# Patient Record
Sex: Female | Born: 1960 | Race: White | Hispanic: No | Marital: Single | Smoking: Former smoker
Health system: Southern US, Community
[De-identification: ages and names within clinical notes are randomized; demographics above are authoritative.]

## PROBLEM LIST (undated history)

## (undated) DIAGNOSIS — R55 Syncope and collapse: Secondary | ICD-10-CM

## (undated) DIAGNOSIS — H409 Unspecified glaucoma: Secondary | ICD-10-CM

## (undated) DIAGNOSIS — I10 Essential (primary) hypertension: Secondary | ICD-10-CM

## (undated) DIAGNOSIS — F329 Major depressive disorder, single episode, unspecified: Secondary | ICD-10-CM

## (undated) DIAGNOSIS — Z9889 Other specified postprocedural states: Secondary | ICD-10-CM

## (undated) DIAGNOSIS — K589 Irritable bowel syndrome without diarrhea: Secondary | ICD-10-CM

## (undated) DIAGNOSIS — D45 Polycythemia vera: Secondary | ICD-10-CM

## (undated) DIAGNOSIS — D751 Secondary polycythemia: Secondary | ICD-10-CM

## (undated) DIAGNOSIS — M810 Age-related osteoporosis without current pathological fracture: Secondary | ICD-10-CM

## (undated) DIAGNOSIS — H209 Unspecified iridocyclitis: Secondary | ICD-10-CM

## (undated) DIAGNOSIS — I5032 Chronic diastolic (congestive) heart failure: Secondary | ICD-10-CM

## (undated) DIAGNOSIS — M1990 Unspecified osteoarthritis, unspecified site: Secondary | ICD-10-CM

## (undated) DIAGNOSIS — D1803 Hemangioma of intra-abdominal structures: Secondary | ICD-10-CM

## (undated) DIAGNOSIS — G4730 Sleep apnea, unspecified: Secondary | ICD-10-CM

## (undated) DIAGNOSIS — Z8719 Personal history of other diseases of the digestive system: Secondary | ICD-10-CM

## (undated) DIAGNOSIS — L400 Psoriasis vulgaris: Secondary | ICD-10-CM

## (undated) DIAGNOSIS — Z79620 Long term (current) use of immunosuppressive biologic: Secondary | ICD-10-CM

## (undated) DIAGNOSIS — K760 Fatty (change of) liver, not elsewhere classified: Secondary | ICD-10-CM

## (undated) DIAGNOSIS — M1712 Unilateral primary osteoarthritis, left knee: Secondary | ICD-10-CM

## (undated) DIAGNOSIS — L4050 Arthropathic psoriasis, unspecified: Secondary | ICD-10-CM

## (undated) DIAGNOSIS — E785 Hyperlipidemia, unspecified: Secondary | ICD-10-CM

## (undated) DIAGNOSIS — G4700 Insomnia, unspecified: Secondary | ICD-10-CM

## (undated) DIAGNOSIS — I700 Atherosclerosis of aorta: Secondary | ICD-10-CM

## (undated) DIAGNOSIS — G4733 Obstructive sleep apnea (adult) (pediatric): Secondary | ICD-10-CM

## (undated) DIAGNOSIS — F32A Depression, unspecified: Secondary | ICD-10-CM

## (undated) DIAGNOSIS — E039 Hypothyroidism, unspecified: Secondary | ICD-10-CM

## (undated) DIAGNOSIS — R112 Nausea with vomiting, unspecified: Secondary | ICD-10-CM

## (undated) DIAGNOSIS — R0609 Other forms of dyspnea: Secondary | ICD-10-CM

## (undated) DIAGNOSIS — K219 Gastro-esophageal reflux disease without esophagitis: Secondary | ICD-10-CM

## (undated) DIAGNOSIS — M47812 Spondylosis without myelopathy or radiculopathy, cervical region: Secondary | ICD-10-CM

## (undated) DIAGNOSIS — M65949 Unspecified synovitis and tenosynovitis, unspecified hand: Secondary | ICD-10-CM

## (undated) HISTORY — PX: CHOLECYSTECTOMY: SHX55

## (undated) HISTORY — PX: TONSILLECTOMY: SUR1361

## (undated) HISTORY — PX: APPENDECTOMY: SHX54

## (undated) HISTORY — PX: ABDOMINAL HYSTERECTOMY: SHX81

---
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
## 2008-10-06 DIAGNOSIS — L4050 Arthropathic psoriasis, unspecified: Secondary | ICD-10-CM | POA: Insufficient documentation

## 2015-01-07 DIAGNOSIS — D1803 Hemangioma of intra-abdominal structures: Secondary | ICD-10-CM

## 2015-01-07 HISTORY — DX: Hemangioma of intra-abdominal structures: D18.03

## 2015-06-05 DIAGNOSIS — Z8719 Personal history of other diseases of the digestive system: Secondary | ICD-10-CM | POA: Insufficient documentation

## 2015-07-18 DIAGNOSIS — F5104 Psychophysiologic insomnia: Secondary | ICD-10-CM | POA: Insufficient documentation

## 2015-09-21 DIAGNOSIS — D1803 Hemangioma of intra-abdominal structures: Secondary | ICD-10-CM | POA: Insufficient documentation

## 2015-09-21 DIAGNOSIS — D751 Secondary polycythemia: Secondary | ICD-10-CM | POA: Insufficient documentation

## 2015-11-15 ENCOUNTER — Other Ambulatory Visit: Payer: Self-pay | Admitting: Internal Medicine

## 2015-11-15 DIAGNOSIS — Z1231 Encounter for screening mammogram for malignant neoplasm of breast: Secondary | ICD-10-CM

## 2015-12-21 ENCOUNTER — Encounter: Payer: Self-pay | Admitting: Radiology

## 2015-12-21 ENCOUNTER — Ambulatory Visit
Admission: RE | Admit: 2015-12-21 | Discharge: 2015-12-21 | Disposition: A | Discharge status: Home / Self Care | Payer: Medicare Other | Source: Ambulatory Visit | Attending: Internal Medicine | Admitting: Internal Medicine

## 2015-12-21 DIAGNOSIS — Z1231 Encounter for screening mammogram for malignant neoplasm of breast: Secondary | ICD-10-CM | POA: Insufficient documentation

## 2015-12-24 DIAGNOSIS — K219 Gastro-esophageal reflux disease without esophagitis: Secondary | ICD-10-CM | POA: Insufficient documentation

## 2016-03-26 DIAGNOSIS — K760 Fatty (change of) liver, not elsewhere classified: Secondary | ICD-10-CM | POA: Insufficient documentation

## 2016-07-11 ENCOUNTER — Other Ambulatory Visit: Payer: Self-pay | Admitting: Internal Medicine

## 2016-07-11 DIAGNOSIS — R911 Solitary pulmonary nodule: Secondary | ICD-10-CM

## 2016-07-18 ENCOUNTER — Ambulatory Visit
Admission: RE | Admit: 2016-07-18 | Discharge: 2016-07-18 | Disposition: A | Discharge status: Home / Self Care | Payer: Medicare Other | Source: Ambulatory Visit | Attending: Internal Medicine | Admitting: Internal Medicine

## 2016-07-18 DIAGNOSIS — I700 Atherosclerosis of aorta: Secondary | ICD-10-CM | POA: Diagnosis not present

## 2016-07-18 DIAGNOSIS — R911 Solitary pulmonary nodule: Secondary | ICD-10-CM | POA: Insufficient documentation

## 2016-10-22 DIAGNOSIS — M1712 Unilateral primary osteoarthritis, left knee: Secondary | ICD-10-CM | POA: Insufficient documentation

## 2017-01-27 ENCOUNTER — Other Ambulatory Visit: Payer: Federal, State, Local not specified - PPO

## 2017-01-30 ENCOUNTER — Inpatient Hospital Stay: Admission: RE | Admit: 2017-01-30 | Payer: Federal, State, Local not specified - PPO | Source: Ambulatory Visit

## 2017-02-05 ENCOUNTER — Ambulatory Visit: Admission: RE | Admit: 2017-02-05 | Payer: Medicare Other | Source: Ambulatory Visit | Admitting: Orthopedic Surgery

## 2017-02-05 ENCOUNTER — Encounter: Admission: RE | Payer: Self-pay | Source: Ambulatory Visit

## 2017-02-05 SURGERY — ARTHROSCOPY, KNEE, WITH MEDIAL MENISCECTOMY
Anesthesia: Choice | Laterality: Left

## 2017-03-03 ENCOUNTER — Other Ambulatory Visit: Payer: Self-pay

## 2017-03-03 ENCOUNTER — Encounter
Admission: RE | Admit: 2017-03-03 | Discharge: 2017-03-03 | Disposition: A | Discharge status: Home / Self Care | Payer: Medicare Other | Source: Ambulatory Visit | Attending: Orthopedic Surgery | Admitting: Orthopedic Surgery

## 2017-03-03 DIAGNOSIS — Z01818 Encounter for other preprocedural examination: Secondary | ICD-10-CM | POA: Insufficient documentation

## 2017-03-03 HISTORY — DX: Syncope and collapse: R55

## 2017-03-03 HISTORY — DX: Unspecified iridocyclitis: H20.9

## 2017-03-03 HISTORY — DX: Spondylosis without myelopathy or radiculopathy, cervical region: M47.812

## 2017-03-03 HISTORY — DX: Secondary polycythemia: D75.1

## 2017-03-03 HISTORY — DX: Major depressive disorder, single episode, unspecified: F32.9

## 2017-03-03 HISTORY — DX: Gastro-esophageal reflux disease without esophagitis: K21.9

## 2017-03-03 HISTORY — DX: Hypothyroidism, unspecified: E03.9

## 2017-03-03 HISTORY — DX: Unspecified osteoarthritis, unspecified site: M19.90

## 2017-03-03 HISTORY — DX: Hemangioma of intra-abdominal structures: D18.03

## 2017-03-03 HISTORY — DX: Other specified postprocedural states: Z98.890

## 2017-03-03 HISTORY — DX: Irritable bowel syndrome, unspecified: K58.9

## 2017-03-03 HISTORY — DX: Unspecified glaucoma: H40.9

## 2017-03-03 HISTORY — DX: Depression, unspecified: F32.A

## 2017-03-03 HISTORY — DX: Nausea with vomiting, unspecified: R11.2

## 2017-03-03 NOTE — Patient Instructions (Signed)
Your procedure is scheduled on: Thursday 03/12/17 Report to [place]. To find out your arrival time please call [phone] between 1PM - 3PM on Wednesday 03/11/17.  Remember: Instructions that are not followed completely may result in serious medical risk, up to and including death, or upon the discretion of your surgeon and anesthesiologist your surgery may need to be rescheduled.     _X__ 1. Do not eat food after midnight the night before your procedure.                 No gum chewing or hard candies. You may drink clear liquids up to 2 hours                 before you are scheduled to arrive for your surgery- DO not drink clear                 liquids within 2 hours of the start of your surgery.                 Clear Liquids include:  water, apple juice without pulp, clear carbohydrate                 drink such as Clearfast of Gartorade, Black Coffee or Tea (Do not add                 anything to coffee or tea).  __X__2.  On the morning of surgery brush your teeth with toothpaste and water, you                 may rinse your mouth with mouthwash if you wish.  Do not swallow any              toothpaste of mouthwash.     _X__ 3.  No Alcohol for 24 hours before or after surgery.   _X__ 4.  Do Not Smoke or use e-cigarettes For 24 Hours Prior to Your Surgery.                 Do not use any chewable tobacco products for at least 6 hours prior to                 surgery.  ____  5.  Bring all medications with you on the day of surgery if instructed.   ____  6.  Notify your doctor if there is any change in your medical condition      (cold, fever, infections).     Do not wear jewelry, make-up, hairpins, clips or nail polish. Do not wear lotions, powders, or perfumes. You may wear deodorant. Do not shave 48 hours prior to surgery. Men may shave face and neck. Do not bring valuables to the hospital.    [facility] is not responsible  for any belongings or valuables.  Contacts, dentures or bridgework may not be worn into surgery. Leave your suitcase in the car. After surgery it may be brought to your room. For patients admitted to the hospital, discharge time is determined by your treatment team.   Patients discharged the day of surgery will not be allowed to drive home.   Please read over the following fact sheets that you were given:   MRSA Information   __X__ Take these medicines the morning of surgery with A SIP OF WATER:    1. LEVOTHYROXINE  2.   3.   4.  5.  6.  ____ Fleet Enema (as directed)   __X__ Use CHG Soap as directed (USE YOUR REGULAR SOAP)  ____ Use inhalers on the day of surgery  ____ Stop metformin/Janumet/Farxiga 2 days prior to surgery    ____ Take 1/2 of usual insulin dose the night before surgery. No insulin the morning          of surgery.   __X__ Stop Blood Thinners Coumadin/Plavix/Xarelto/Pleta/Pradaxa/Eliquis/Effient/Aspirin  on STOPPED 02/05/17  __X__ Stop Anti-inflammatories such as Advil, Ibuprofen, Motrin, BC or Goodies  Powder, Naprosyn, Naproxen, Aleve    __X__ Stop herbal supplements, fish oil or vitamin E until after surgery.    ____ Bring C-Pap to the hospital.

## 2017-03-12 ENCOUNTER — Ambulatory Visit: Payer: Medicare Other | Admitting: Anesthesiology

## 2017-03-12 ENCOUNTER — Encounter
Admission: RE | Disposition: A | Discharge status: Home / Self Care | Payer: Self-pay | Source: Ambulatory Visit | Attending: Orthopedic Surgery

## 2017-03-12 ENCOUNTER — Encounter: Payer: Self-pay | Admitting: Emergency Medicine

## 2017-03-12 ENCOUNTER — Ambulatory Visit
Admission: RE | Admit: 2017-03-12 | Discharge: 2017-03-12 | Disposition: A | Discharge status: Home / Self Care | Payer: Medicare Other | Source: Ambulatory Visit | Attending: Orthopedic Surgery | Admitting: Orthopedic Surgery

## 2017-03-12 DIAGNOSIS — M23222 Derangement of posterior horn of medial meniscus due to old tear or injury, left knee: Secondary | ICD-10-CM | POA: Insufficient documentation

## 2017-03-12 DIAGNOSIS — K219 Gastro-esophageal reflux disease without esophagitis: Secondary | ICD-10-CM | POA: Diagnosis not present

## 2017-03-12 DIAGNOSIS — Z79899 Other long term (current) drug therapy: Secondary | ICD-10-CM | POA: Diagnosis not present

## 2017-03-12 DIAGNOSIS — M175 Other unilateral secondary osteoarthritis of knee: Secondary | ICD-10-CM | POA: Diagnosis not present

## 2017-03-12 DIAGNOSIS — Z87891 Personal history of nicotine dependence: Secondary | ICD-10-CM | POA: Diagnosis not present

## 2017-03-12 DIAGNOSIS — D1803 Hemangioma of intra-abdominal structures: Secondary | ICD-10-CM | POA: Diagnosis not present

## 2017-03-12 DIAGNOSIS — D751 Secondary polycythemia: Secondary | ICD-10-CM | POA: Diagnosis not present

## 2017-03-12 DIAGNOSIS — E039 Hypothyroidism, unspecified: Secondary | ICD-10-CM | POA: Insufficient documentation

## 2017-03-12 DIAGNOSIS — F329 Major depressive disorder, single episode, unspecified: Secondary | ICD-10-CM | POA: Insufficient documentation

## 2017-03-12 DIAGNOSIS — L4050 Arthropathic psoriasis, unspecified: Secondary | ICD-10-CM | POA: Diagnosis not present

## 2017-03-12 HISTORY — PX: KNEE ARTHROSCOPY WITH MEDIAL MENISECTOMY: SHX5651

## 2017-03-12 LAB — URINE DRUG SCREEN, QUALITATIVE (ARMC ONLY)
AMPHETAMINES, UR SCREEN: NOT DETECTED
Barbiturates, Ur Screen: NOT DETECTED
Benzodiazepine, Ur Scrn: NOT DETECTED
Cannabinoid 50 Ng, Ur ~~LOC~~: NOT DETECTED
Cocaine Metabolite,Ur ~~LOC~~: NOT DETECTED
MDMA (ECSTASY) UR SCREEN: NOT DETECTED
METHADONE SCREEN, URINE: NOT DETECTED
OPIATE, UR SCREEN: NOT DETECTED
PHENCYCLIDINE (PCP) UR S: NOT DETECTED
Tricyclic, Ur Screen: NOT DETECTED

## 2017-03-12 SURGERY — ARTHROSCOPY, KNEE, WITH MEDIAL MENISCECTOMY
Anesthesia: General | Site: Knee | Laterality: Left | Wound class: Clean

## 2017-03-12 MED ORDER — FAMOTIDINE 20 MG PO TABS
ORAL_TABLET | ORAL | Status: AC
  Filled 2017-03-12: qty 1

## 2017-03-12 MED ORDER — DEXAMETHASONE SODIUM PHOSPHATE 10 MG/ML IJ SOLN
INTRAMUSCULAR | Status: DC | PRN
  Administered 2017-03-12: 10 mg via INTRAVENOUS

## 2017-03-12 MED ORDER — ACETAMINOPHEN 10 MG/ML IV SOLN
INTRAVENOUS | Status: DC | PRN
  Administered 2017-03-12: 1000 mg via INTRAVENOUS

## 2017-03-12 MED ORDER — SCOPOLAMINE 1 MG/3DAYS TD PT72
1.0000 | MEDICATED_PATCH | Freq: Once | TRANSDERMAL | Status: DC
  Administered 2017-03-12: 1.5 mg via TRANSDERMAL

## 2017-03-12 MED ORDER — MIDAZOLAM HCL 2 MG/2ML IJ SOLN
INTRAMUSCULAR | Status: AC
  Filled 2017-03-12: qty 2

## 2017-03-12 MED ORDER — GLYCOPYRROLATE 0.2 MG/ML IJ SOLN
INTRAMUSCULAR | Status: DC | PRN
  Administered 2017-03-12: 0.2 mg via INTRAVENOUS

## 2017-03-12 MED ORDER — ONDANSETRON HCL 4 MG PO TABS: 4.0000 mg | ORAL_TABLET | Freq: Four times a day (QID) | ORAL | Status: DC | PRN

## 2017-03-12 MED ORDER — PROMETHAZINE HCL 25 MG/ML IJ SOLN
INTRAMUSCULAR | Status: AC
  Filled 2017-03-12: qty 1

## 2017-03-12 MED ORDER — METOCLOPRAMIDE HCL 10 MG PO TABS: 5.0000 mg | ORAL_TABLET | Freq: Three times a day (TID) | ORAL | Status: DC | PRN

## 2017-03-12 MED ORDER — PROPOFOL 10 MG/ML IV BOLUS
INTRAVENOUS | Status: DC | PRN
  Administered 2017-03-12: 200 mg via INTRAVENOUS

## 2017-03-12 MED ORDER — LACTATED RINGERS IV SOLN
INTRAVENOUS | Status: DC
  Administered 2017-03-12 (×2): via INTRAVENOUS

## 2017-03-12 MED ORDER — CEFAZOLIN SODIUM-DEXTROSE 2-3 GM-%(50ML) IV SOLR
INTRAVENOUS | Status: DC | PRN
  Administered 2017-03-12: 2 g via INTRAVENOUS

## 2017-03-12 MED ORDER — FENTANYL CITRATE (PF) 100 MCG/2ML IJ SOLN
INTRAMUSCULAR | Status: AC
  Administered 2017-03-12: 25 ug via INTRAVENOUS
  Filled 2017-03-12: qty 2

## 2017-03-12 MED ORDER — METOCLOPRAMIDE HCL 5 MG/ML IJ SOLN: 5.0000 mg | Freq: Three times a day (TID) | INTRAMUSCULAR | Status: DC | PRN

## 2017-03-12 MED ORDER — CEFAZOLIN SODIUM-DEXTROSE 2-4 GM/100ML-% IV SOLN
INTRAVENOUS | Status: AC
  Filled 2017-03-12: qty 100

## 2017-03-12 MED ORDER — SODIUM CHLORIDE 0.9 % IV SOLN: INTRAVENOUS | Status: DC

## 2017-03-12 MED ORDER — BUPIVACAINE-EPINEPHRINE (PF) 0.5% -1:200000 IJ SOLN
INTRAMUSCULAR | Status: AC
  Filled 2017-03-12: qty 30

## 2017-03-12 MED ORDER — PROMETHAZINE HCL 25 MG/ML IJ SOLN
6.2500 mg | INTRAMUSCULAR | Status: DC | PRN
  Administered 2017-03-12: 6.25 mg via INTRAVENOUS

## 2017-03-12 MED ORDER — HYDROCODONE-ACETAMINOPHEN 5-325 MG PO TABS
1.0000 | ORAL_TABLET | ORAL | Status: DC | PRN
Start: 2017-03-12 — End: 2017-03-12
  Administered 2017-03-12: 1 via ORAL

## 2017-03-12 MED ORDER — SODIUM CHLORIDE FLUSH 0.9 % IV SOLN
INTRAVENOUS | Status: AC
  Filled 2017-03-12: qty 10

## 2017-03-12 MED ORDER — FAMOTIDINE 20 MG PO TABS
20.0000 mg | ORAL_TABLET | Freq: Once | ORAL | Status: AC
  Administered 2017-03-12: 20 mg via ORAL

## 2017-03-12 MED ORDER — MIDAZOLAM HCL 2 MG/2ML IJ SOLN
INTRAMUSCULAR | Status: DC | PRN
  Administered 2017-03-12: 2 mg via INTRAVENOUS

## 2017-03-12 MED ORDER — ACETAMINOPHEN 10 MG/ML IV SOLN
INTRAVENOUS | Status: AC
  Filled 2017-03-12: qty 100

## 2017-03-12 MED ORDER — FENTANYL CITRATE (PF) 100 MCG/2ML IJ SOLN
INTRAMUSCULAR | Status: DC | PRN
  Administered 2017-03-12 (×2): 50 ug via INTRAVENOUS

## 2017-03-12 MED ORDER — HYDROMORPHONE HCL 1 MG/ML IJ SOLN
INTRAMUSCULAR | Status: DC | PRN
  Administered 2017-03-12: .5 mg via INTRAVENOUS
  Administered 2017-03-12: 0.5 mg via INTRAVENOUS

## 2017-03-12 MED ORDER — FENTANYL CITRATE (PF) 100 MCG/2ML IJ SOLN
INTRAMUSCULAR | Status: AC
  Filled 2017-03-12: qty 2

## 2017-03-12 MED ORDER — FENTANYL CITRATE (PF) 100 MCG/2ML IJ SOLN
25.0000 ug | INTRAMUSCULAR | Status: DC | PRN
Start: 2017-03-12 — End: 2017-03-12
  Administered 2017-03-12 (×2): 25 ug via INTRAVENOUS

## 2017-03-12 MED ORDER — BUPIVACAINE-EPINEPHRINE (PF) 0.5% -1:200000 IJ SOLN
INTRAMUSCULAR | Status: DC | PRN
Start: 2017-03-12 — End: 2017-03-12
  Administered 2017-03-12: 30 mL

## 2017-03-12 MED ORDER — PROPOFOL 10 MG/ML IV BOLUS
INTRAVENOUS | Status: AC
  Filled 2017-03-12: qty 20

## 2017-03-12 MED ORDER — ONDANSETRON HCL 4 MG/2ML IJ SOLN
INTRAMUSCULAR | Status: DC | PRN
  Administered 2017-03-12: 4 mg via INTRAVENOUS

## 2017-03-12 MED ORDER — HYDROCODONE-ACETAMINOPHEN 5-325 MG PO TABS
ORAL_TABLET | ORAL | Status: AC
  Administered 2017-03-12: 1 via ORAL
  Filled 2017-03-12: qty 1

## 2017-03-12 MED ORDER — ONDANSETRON HCL 4 MG/2ML IJ SOLN: 4.0000 mg | Freq: Four times a day (QID) | INTRAMUSCULAR | Status: DC | PRN

## 2017-03-12 MED ORDER — HYDROCODONE-ACETAMINOPHEN 5-325 MG PO TABS: 1.0000 | ORAL_TABLET | Freq: Four times a day (QID) | ORAL | 0 refills | Status: DC | PRN

## 2017-03-12 MED ORDER — HYDROMORPHONE HCL 1 MG/ML IJ SOLN
INTRAMUSCULAR | Status: AC
  Filled 2017-03-12: qty 1

## 2017-03-12 MED ORDER — SCOPOLAMINE 1 MG/3DAYS TD PT72
MEDICATED_PATCH | TRANSDERMAL | Status: AC
  Administered 2017-03-12: 1.5 mg via TRANSDERMAL
  Filled 2017-03-12: qty 1

## 2017-03-12 MED ORDER — LIDOCAINE HCL (CARDIAC) 20 MG/ML IV SOLN
INTRAVENOUS | Status: DC | PRN
  Administered 2017-03-12: 100 mg via INTRAVENOUS

## 2017-03-12 SURGICAL SUPPLY — 25 items
BANDAGE ACE 4X5 VEL STRL LF (GAUZE/BANDAGES/DRESSINGS) IMPLANT
BLADE INCISOR PLUS 4.5 (BLADE) IMPLANT
CHLORAPREP W/TINT 26ML (MISCELLANEOUS) ×3 IMPLANT
CUFF TOURN 24 STER (MISCELLANEOUS) IMPLANT
CUFF TOURN 30 STER DUAL PORT (MISCELLANEOUS) IMPLANT
GAUZE SPONGE 4X4 12PLY STRL (GAUZE/BANDAGES/DRESSINGS) ×3 IMPLANT
GLOVE SURG SYN 9.0  PF PI (GLOVE) ×2
GLOVE SURG SYN 9.0 PF PI (GLOVE) ×1 IMPLANT
GOWN SRG 2XL LVL 4 RGLN SLV (GOWNS) ×1 IMPLANT
GOWN STRL NON-REIN 2XL LVL4 (GOWNS) ×2
GOWN STRL REUS W/ TWL LRG LVL3 (GOWN DISPOSABLE) ×2 IMPLANT
GOWN STRL REUS W/TWL LRG LVL3 (GOWN DISPOSABLE) ×4
IV LACTATED RINGER IRRG 3000ML (IV SOLUTION) ×4
IV LR IRRIG 3000ML ARTHROMATIC (IV SOLUTION) ×2 IMPLANT
KIT TURNOVER KIT A (KITS) ×3 IMPLANT
MANIFOLD NEPTUNE II (INSTRUMENTS) ×3 IMPLANT
PACK ARTHROSCOPY KNEE (MISCELLANEOUS) ×3 IMPLANT
SCALPEL PROTECTED #11 DISP (BLADE) ×3 IMPLANT
SET TUBE SUCT SHAVER OUTFL 24K (TUBING) ×3 IMPLANT
SET TUBE TIP INTRA-ARTICULAR (MISCELLANEOUS) ×3 IMPLANT
SUT ETHILON 4-0 (SUTURE) ×2
SUT ETHILON 4-0 FS2 18XMFL BLK (SUTURE) ×1
SUTURE ETHLN 4-0 FS2 18XMF BLK (SUTURE) ×1 IMPLANT
TUBING ARTHRO INFLOW-ONLY STRL (TUBING) ×3 IMPLANT
WAND COBLATION FLOW 50 (SURGICAL WAND) ×3 IMPLANT

## 2017-03-12 NOTE — Anesthesia Post-op Follow-up Note (Signed)
Anesthesia QCDR form completed.        

## 2017-03-12 NOTE — Discharge Instructions (Addendum)
AMBULATORY SURGERY  DISCHARGE INSTRUCTIONS   1) The drugs that you were given will stay in your system until tomorrow so for the next 24 hours you should not:  A) Drive an automobile B) Make any legal decisions C) Drink any alcoholic beverage   2) You may resume regular meals tomorrow.  Today it is better to start with liquids and gradually work up to solid foods.  You may eat anything you prefer, but it is better to start with liquids, then soup and crackers, and gradually work up to solid foods.   3) Please notify your doctor immediately if you have any unusual bleeding, trouble breathing, redness and pain at the surgery site, drainage, fever, or pain not relieved by medication. 4)   5) Your post-operative visit with Dr.                                     is: Date:                        Time:    Please call to schedule your post-operative visit.  6) Additional Instructions:       Weightbearing as tolerated on left leg to try to minimize activities throughout the weekend.  Resume all normal medications.  Pain medicine as directed.  Leave dressing in place and keep clean and dry.

## 2017-03-12 NOTE — Transfer of Care (Signed)
Immediate Anesthesia Transfer of Care Note  Patient: Helen Garcia  Procedure(s) Performed: KNEE ARTHROSCOPY WITH MEDIAL MENISECTOMY (Left Knee)  Patient Location: PACU  Anesthesia Type:General  Level of Consciousness: sedated  Airway & Oxygen Therapy: Patient Spontanous Breathing and Patient connected to face mask oxygen  Post-op Assessment: Report given to RN and Post -op Vital signs reviewed and stable  Post vital signs: Reviewed and stable  Last Vitals:  Vitals:   03/12/17 1029 03/12/17 1308  BP: (!) 141/81 98/64  Pulse: 79 (!) 49  Resp: 17 12  Temp: 36.7 C (!) 36.2 C  SpO2: 98% 98%    Last Pain:  Vitals:   03/12/17 1308  TempSrc:   PainSc: (P) Asleep         Complications: No apparent anesthesia complications

## 2017-03-12 NOTE — H&P (Signed)
Reviewed paper H+P, will be scanned into chart. No changes noted.  

## 2017-03-12 NOTE — Anesthesia Preprocedure Evaluation (Signed)
Anesthesia Evaluation  Patient identified by MRN, date of birth, ID band Patient awake    Reviewed: Allergy & Precautions, H&P , NPO status , Patient's Chart, lab work & pertinent test results, reviewed documented beta blocker date and time   History of Anesthesia Complications (+) PONV and history of anesthetic complications  Airway Mallampati: II  TM Distance: >3 FB Neck ROM: full    Dental  (+) Caps, Dental Advidsory Given   Pulmonary neg pulmonary ROS, former smoker,           Cardiovascular Exercise Tolerance: Good negative cardio ROS       Neuro/Psych PSYCHIATRIC DISORDERS Depression negative neurological ROS     GI/Hepatic negative GI ROS, GERD  Medicated,(+) Cirrhosis  (medically induced)      , Liver hemangioma, NAFLD   Endo/Other  neg diabetesHypothyroidism   Renal/GU negative Renal ROS  negative genitourinary   Musculoskeletal   Abdominal   Peds  Hematology negative hematology ROS (+)   Anesthesia Other Findings Past Medical History: No date: Arthritis     Comment:  Psoriatic, Osteoarthritis 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 No date: Cervical spondylosis No date: Cervical spondylosis No date: Depression No date: Erythrocytosis No date: GERD (gastroesophageal reflux disease) No date: Glaucoma No date: Hypothyroidism No date: IBS (irritable bowel syndrome) No date: Polycythemia     Comment:  Jak 2 negative No date: PONV (postoperative nausea and vomiting) No date: Uveitis No date: Vasovagal episode   Reproductive/Obstetrics negative OB ROS                             Anesthesia Physical Anesthesia Plan  ASA: II  Anesthesia Plan: General   Post-op Pain Management:    Induction: Intravenous  PONV Risk Score and Plan: 4 or greater and Ondansetron, Dexamethasone and Scopolamine patch - Pre-op  Airway Management Planned: LMA  Additional Equipment:    Intra-op Plan:   Post-operative Plan: Extubation in OR  Informed Consent: I have reviewed the patients History and Physical, chart, labs and discussed the procedure including the risks, benefits and alternatives for the proposed anesthesia with the patient or authorized representative who has indicated his/her understanding and acceptance.   Dental Advisory Given  Plan Discussed with: Anesthesiologist, CRNA and Surgeon  Anesthesia Plan Comments:         Anesthesia Quick Evaluation

## 2017-03-12 NOTE — Op Note (Signed)
03/12/2017  1:07 PM  PATIENT:  Helen Garcia  57 y.o. female  PRE-OPERATIVE DIAGNOSIS:  DERANGEMENT OF POSTERIOR HORN OF MEDIAL MENISCUS  POST-OPERATIVE DIAGNOSIS:  DERANGEMENT OF POSTERIOR HORN OF MEDIAL MENISCUS  PROCEDURE:  Procedure(s): KNEE ARTHROSCOPY WITH MEDIAL MENISECTOMY (Left)  SURGEON: Laurene Footman, MD  ASSISTANTS: None  ANESTHESIA:   general  EBL:  No intake/output data recorded.  BLOOD ADMINISTERED:none  DRAINS: none   LOCAL MEDICATIONS USED:  MARCAINE     SPECIMEN:  No Specimen  DISPOSITION OF SPECIMEN:  N/A  COUNTS:  YES  TOURNIQUET:  * Missing tourniquet times found for documented tourniquets in log: [id number] *  IMPLANTS: None  DICTATION: .Dragon Dictation patient was brought to the operating room and after adequate general anesthesia was obtained the left leg was placed in arthroscopic leg holder with a tourniquet applied but not inflated.  After prepping and draping the leg in usual sterile fashion appropriate patient identification and timeout procedures were completed.  The inferior lateral portal was made and the arthroscope was introduced.  Initial inspection revealed normal tracking of the patella with mild chondromalacia in the femoral trochlea minimal chondromalacia on the lateral facet of the patella, on the medial gutter and lateral gutters there were no loose bodies in the medial compartment inferior medial portal was made but there is a obvious tear at the junction of the middle third posterior third with a radial tear that extended posteriorly with a horizontal component this was debrided with the use of ArthroCare wand back to a stable margin resecting approximately half the meniscus.  There were a few areas of articular cartilage damage to the medial femoral condyle which is minimal fibrillation of the tibial condyle medial and lateral ACL is intact and the lateral compartment was normal except for that fibrillation anteriorly on the tibial  condyle after addressing the meniscal pathology and thoroughly irrigating out the knee all instrumentation was withdrawn and sterile sterile dressing applied after infiltration of 10 cc of half percent Sensorcaine with epinephrine into the areas of the portals Xeroform 4 x 4's web roll and Ace wrap applied  PLAN OF CARE: Discharge to home after PACU  PATIENT DISPOSITION:  PACU - hemodynamically stable.

## 2017-03-12 NOTE — Anesthesia Procedure Notes (Signed)
Procedure Name: LMA Insertion Date/Time: 03/12/2017 12:42 PM Performed by: Nelda Marseille, CRNA Pre-anesthesia Checklist: Patient identified, Patient being monitored, Timeout performed, Emergency Drugs available and Suction available Patient Re-evaluated:Patient Re-evaluated prior to induction Oxygen Delivery Method: Circle system utilized Preoxygenation: Pre-oxygenation with 100% oxygen Induction Type: IV induction Ventilation: Mask ventilation without difficulty LMA: LMA inserted LMA Size: 3.5 Tube type: Oral Number of attempts: 1 Placement Confirmation: positive ETCO2 and breath sounds checked- equal and bilateral Tube secured with: Tape Dental Injury: Teeth and Oropharynx as per pre-operative assessment

## 2017-03-13 ENCOUNTER — Encounter: Payer: Self-pay | Admitting: Orthopedic Surgery

## 2017-03-13 NOTE — Anesthesia Postprocedure Evaluation (Signed)
Anesthesia Post Note  Patient: Helen Garcia  Procedure(s) Performed: KNEE ARTHROSCOPY WITH MEDIAL MENISECTOMY (Left Knee)  Patient location during evaluation: PACU Anesthesia Type: General Level of consciousness: awake and alert Pain management: pain level controlled Vital Signs Assessment: post-procedure vital signs reviewed and stable Respiratory status: spontaneous breathing, nonlabored ventilation, respiratory function stable and patient connected to nasal cannula oxygen Cardiovascular status: blood pressure returned to baseline and stable Postop Assessment: no apparent nausea or vomiting Anesthetic complications: no     Last Vitals:  Vitals:   03/12/17 1608 03/12/17 1643  BP: 119/70 124/72  Pulse: (!) 55 (!) 55  Resp: 16 16  Temp: (!) 36.1 C (!) 36.1 C  SpO2: 97% 98%    Last Pain:  Vitals:   03/13/17 0846  TempSrc:   PainSc: 0-No pain                 Martha Clan

## 2017-09-18 ENCOUNTER — Other Ambulatory Visit: Payer: Self-pay | Admitting: Internal Medicine

## 2017-09-18 DIAGNOSIS — Z1231 Encounter for screening mammogram for malignant neoplasm of breast: Secondary | ICD-10-CM

## 2017-11-18 ENCOUNTER — Ambulatory Visit
Admission: RE | Admit: 2017-11-18 | Discharge: 2017-11-18 | Disposition: A | Discharge status: Home / Self Care | Payer: Medicare Other | Source: Ambulatory Visit | Attending: Internal Medicine | Admitting: Internal Medicine

## 2017-11-18 DIAGNOSIS — Z1231 Encounter for screening mammogram for malignant neoplasm of breast: Secondary | ICD-10-CM | POA: Insufficient documentation

## 2017-11-18 DIAGNOSIS — K529 Noninfective gastroenteritis and colitis, unspecified: Secondary | ICD-10-CM | POA: Insufficient documentation

## 2018-02-16 ENCOUNTER — Other Ambulatory Visit: Payer: Self-pay

## 2018-02-16 ENCOUNTER — Emergency Department
Admission: EM | Admit: 2018-02-16 | Discharge: 2018-02-16 | Disposition: A | Discharge status: Home / Self Care | Payer: Medicare Other | Attending: Emergency Medicine | Admitting: Emergency Medicine

## 2018-02-16 ENCOUNTER — Encounter: Payer: Self-pay | Admitting: *Deleted

## 2018-02-16 ENCOUNTER — Emergency Department: Payer: Medicare Other

## 2018-02-16 DIAGNOSIS — Z87891 Personal history of nicotine dependence: Secondary | ICD-10-CM | POA: Insufficient documentation

## 2018-02-16 DIAGNOSIS — D751 Secondary polycythemia: Secondary | ICD-10-CM

## 2018-02-16 DIAGNOSIS — E039 Hypothyroidism, unspecified: Secondary | ICD-10-CM | POA: Diagnosis not present

## 2018-02-16 DIAGNOSIS — Z79899 Other long term (current) drug therapy: Secondary | ICD-10-CM | POA: Insufficient documentation

## 2018-02-16 DIAGNOSIS — Z7982 Long term (current) use of aspirin: Secondary | ICD-10-CM | POA: Insufficient documentation

## 2018-02-16 DIAGNOSIS — D45 Polycythemia vera: Secondary | ICD-10-CM | POA: Insufficient documentation

## 2018-02-16 DIAGNOSIS — R5383 Other fatigue: Secondary | ICD-10-CM | POA: Diagnosis present

## 2018-02-16 LAB — CBC
HCT: 49.5 % — ABNORMAL HIGH (ref 36.0–46.0)
HEMOGLOBIN: 16.5 g/dL — AB (ref 12.0–15.0)
MCH: 29.8 pg (ref 26.0–34.0)
MCHC: 33.3 g/dL (ref 30.0–36.0)
MCV: 89.5 fL (ref 80.0–100.0)
NRBC: 0 % (ref 0.0–0.2)
PLATELETS: 270 10*3/uL (ref 150–400)
RBC: 5.53 MIL/uL — AB (ref 3.87–5.11)
RDW: 12.4 % (ref 11.5–15.5)
WBC: 7.9 10*3/uL (ref 4.0–10.5)

## 2018-02-16 LAB — BASIC METABOLIC PANEL
ANION GAP: 7 (ref 5–15)
BUN: 14 mg/dL (ref 6–20)
CALCIUM: 10 mg/dL (ref 8.9–10.3)
CO2: 28 mmol/L (ref 22–32)
Chloride: 104 mmol/L (ref 98–111)
Creatinine, Ser: 0.78 mg/dL (ref 0.44–1.00)
GLUCOSE: 95 mg/dL (ref 70–99)
Potassium: 4.2 mmol/L (ref 3.5–5.1)
Sodium: 139 mmol/L (ref 135–145)

## 2018-02-16 LAB — HEPATIC FUNCTION PANEL
ALBUMIN: 4.1 g/dL (ref 3.5–5.0)
ALT: 23 U/L (ref 0–44)
AST: 20 U/L (ref 15–41)
Alkaline Phosphatase: 114 U/L (ref 38–126)
Bilirubin, Direct: 0.1 mg/dL (ref 0.0–0.2)
Indirect Bilirubin: 0.4 mg/dL (ref 0.3–0.9)
TOTAL PROTEIN: 7.2 g/dL (ref 6.5–8.1)
Total Bilirubin: 0.5 mg/dL (ref 0.3–1.2)

## 2018-02-16 LAB — TROPONIN I

## 2018-02-16 MED ORDER — SODIUM CHLORIDE 0.45 % IV BOLUS
500.0000 mL | Freq: Once | INTRAVENOUS | Status: DC
  Filled 2018-02-16: qty 500

## 2018-02-16 NOTE — ED Triage Notes (Signed)
Pt to ED reporting increased SOB and weakness x 3 days. Increased dyspnea upon exertion. Pt is diphoretic in triage.  Hx of Secondary polycythemia erythrocytosis. PT reports feeling like this usually when her RBC counts are elevated.

## 2018-02-16 NOTE — ED Provider Notes (Addendum)
[facility] Emergency Department Provider Note  ____________________________________________   I have reviewed the triage vital signs and the nursing notes. Where available I have reviewed prior notes and, if possible and indicated, outside hospital notes.    HISTORY  Chief Complaint Shortness of Breath and Weakness    HPI Helen Garcia is a 58 y.o. 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  She states that she feels generally lethargic.  She has occasional lightheadedness.  She does not have any chest pain and she is not technically short of breath she states is just that she feels "weak".  She has had CT scans for the symptoms.  It is been determined that this is her normal symptoms.  They usually do blood draws when her hematocrit is 45.  She wants to know what her hematocrit is.  No leg swelling no history of PE.  No chest pain no shortness of breath.  Unclear why she came here, she does get her care at [facility].  It appears the last time that they did a blood draw was November 8 of 2019.  It is usually every 3 months.  He does have a history of Graves' disease, with ablation in the past, she has had extensive work-up for polycythemia including at the [facility].  Her erythrocytosis was unclear.  Negative work-ups for autoimmune conditions in the past.  She states that when she has flares sometimes she is wheelchair-bound but she does not feel that bad.  Apparently she did spend a lot of time in a wheelchair in the past.  Please see further notes from excellent outpatient notes at [facility].  She declines to put on a gown or climb in the bed.  She states "I know what this is".    Past Medical History:  Diagnosis Date  . Arthritis    Psoriatic, Osteoarthritis  . Cavernous hemangioma of liver 2017  . Cervical spondylosis   . Cervical  spondylosis   . Depression   . Erythrocytosis   . GERD (gastroesophageal reflux disease)   . Glaucoma   . Hypothyroidism   . IBS (irritable bowel syndrome)   . 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 PONV (postoperative nausea and vomiting)   . Uveitis   . Vasovagal episode     There are no active problems to display for this patient.   Past Surgical History:  Procedure Laterality Date  . ABDOMINAL HYSTERECTOMY    . APPENDECTOMY    . CHOLECYSTECTOMY    . EYE SURGERY  1974   eye musckle  . KNEE ARTHROSCOPY WITH MEDIAL MENISECTOMY Left 03/12/2017   Procedure: KNEE ARTHROSCOPY WITH MEDIAL MENISECTOMY;  Surgeon: Hessie Knows, MD;  Location: [facility] ORS;  Service: Orthopedics;  Laterality: Left;  . TONSILLECTOMY      Prior to Admission medications   Medication Sig Start Date End Date Taking? Authorizing Provider  aspirin 81 MG chewable tablet Chew 81 mg by mouth daily.    [provider]  Cholecalciferol (VITAMIN D3) 2000 units capsule Take 2,000 Units by mouth daily.    [provider]  clotrimazole (LOTRIMIN) 1 % cream Apply 1 application topically daily as needed (yeast infections).    [provider]  HYDROcodone-acetaminophen (NORCO) 5-325 MG tablet Take 1 tablet by mouth every 6 (six) hours as needed for moderate pain. 03/12/17   Hessie Knows, MD  levothyroxine (SYNTHROID, LEVOTHROID) 100 MCG tablet Take 100 mcg by mouth daily before breakfast.  07/08/16   [provider]  Multiple Vitamin (MULTIVITAMIN WITH MINERALS) TABS tablet Take 1 tablet by mouth daily.    [provider]  nabumetone (RELAFEN) 750 MG tablet Take 750 mg by mouth 2 (two) times daily. 04/24/16   [provider]  Probiotic Product (PROBIOTIC DAILY PO) Take 1 capsule by mouth daily.    [provider]  Tofacitinib Citrate 5 MG TABS Take 5 mg by mouth 2 (two) times daily. Leonarda Salon 06/03/16   [provider]    Allergies Abatacept and  Codeine  Family History  Problem Relation Age of Onset  . Breast cancer Neg Hx     Social History Social History   Tobacco Use  . Smoking status: Former Research scientist (life sciences)  . Smokeless tobacco: Never Used  Substance Use Topics  . Alcohol use: No    Frequency: Never  . Drug use: Yes    Types: Marijuana    Review of Systems Constitutional: No fever/chills Eyes: No visual changes. ENT: No sore throat. No stiff neck no neck pain Cardiovascular: Denies chest pain. Respiratory: Denies shortness of breath. Gastrointestinal:   no vomiting.  No diarrhea.  No constipation. Genitourinary: Negative for dysuria. Musculoskeletal: Negative lower extremity swelling Skin: Negative for rash. Neurological: Negative for severe headaches, focal weakness or numbness.   ____________________________________________   PHYSICAL EXAM:  VITAL SIGNS: ED Triage Vitals  Enc Vitals Group     BP 02/16/18 1849 128/85     Pulse Rate 02/16/18 1524 90     Resp 02/16/18 1524 16     Temp 02/16/18 1524 98 F (36.7 C)     Temp Source 02/16/18 1524 Oral     SpO2 02/16/18 1524 96 %     Weight 02/16/18 1527 223 lb 15.8 oz (101.6 kg)     Height 02/16/18 1527 5\' 5"  (1.651 m)     Head Circumference --      Peak Flow --      Pain Score 02/16/18 1526 0     Pain Loc --      Pain Edu? --      Excl. in [place]? --     Constitutional: Alert and oriented. Well appearing and in no acute distress. Eyes: Conjunctivae are normal Head: Atraumatic HEENT: No congestion/rhinnorhea. Mucous membranes are moist.  Oropharynx non-erythematous Neck:   Nontender with no meningismus, no masses, no stridor Cardiovascular: Normal rate, regular rhythm. Grossly normal heart sounds.  Good peripheral circulation. Respiratory: Normal respiratory effort.  No retractions. Lungs CTAB. Abdominal: Soft and nontender. No distention. No guarding no rebound Back:  There is no focal tenderness or step off.  there is no midline tenderness there are no  lesions noted. there is no CVA tenderness Musculoskeletal: No lower extremity tenderness, no upper extremity tenderness. No joint effusions, no DVT signs strong distal pulses no edema Neurologic:  Normal speech and language. No gross focal neurologic deficits are appreciated.  Skin:  Skin is warm, dry and intact. No rash noted. Psychiatric: Mood and affect are normal. Speech and behavior are normal.  ____________________________________________   LABS (all labs ordered are listed, but only abnormal results are displayed)  Labs Reviewed  CBC - Abnormal; Notable for the following components:      Result Value   RBC 5.53 (*)    Hemoglobin 16.5 (*)    HCT 49.5 (*)    All other components within normal limits  BASIC METABOLIC PANEL  TROPONIN I    Pertinent labs  results that were available during my care of the patient were reviewed by me and considered in my medical decision making (see chart for details). ____________________________________________  EKG  I personally interpreted any EKGs ordered by me or triage Normal axis, no acute ST elevation or depression, sinus rhythm.  Nonspecific ST changes. ____________________________________________  RADIOLOGY  Pertinent labs & imaging results that were available during my care of the patient were reviewed by me and considered in my medical decision making (see chart for details). If possible, patient and/or family made aware of any abnormal findings.  No results found. ____________________________________________    PROCEDURES  Procedure(s) performed: None  Procedures  Critical Care performed: None  ____________________________________________   INITIAL IMPRESSION / ASSESSMENT AND PLAN / ED COURSE  Pertinent labs & imaging results that were available during my care of the patient were reviewed by me and considered in my medical decision making (see chart for details).  Patient here with polycythemia vera, no evidence of  acute DVT PE low suspicion for ACS, she states is exact same symptoms she gets every 3 months.  She just wanted know what her hemoglobin hematocrit were.  They are elevated.  Even for her.  We do not routinely do phlebotomy in the ER.  And her preference would be to go to see her doctor tomorrow possible.  And I did explain to her that I have low suspicion for PE but is impossible to rule that out without CT scan and she refuses stating that she knows that this is exactly her usual symptoms.  We will send a troponin just because of her general state of fatigue and slightly increased risk factors and if that is negative we will talk to her hematologist about what else they would like [place] to do ----------------------------------------- 7:32 PM on 02/16/2018 -----------------------------------------  At this time we are calling hematology, [facility]  ----------------------------------------- 8:34 PM on 02/16/2018 -----------------------------------------  I talked to Dr. Peyton Najjar, at [facility] hematology who recommends a pull 400 cc of blood off the patient and give her a normal saline infusion and then watch her for 2 hours.  I talked to Dr. Rogue Bussing of our heme-onc service who agrees with this.  We will therefore draw 400 cc of blood.  Patient gives consent to this procedure.  She understands risk benefits and alternative.  We will give her IV fluid afterwards and reassess.   ----------------------------------------- 8:57 PM on 02/16/2018 -----------------------------------------  She informs [place] that normally she has to have a special 14-gauge Angiocath that they only have at the cancer center for this procedure.  We do not have that available.  I explained that we are certainly willing to try with what we have, if not she can certainly go to the infusion center tomorrow morning and get it done.  Patient like [place] to try.  Has had symptoms for several days, is unclear why she did not elect to go to the  infusion center today, they regularly do this for her and the way that she would like.  However, we are doing our best to accommodate her.  ----------------------------------------- 9:20 PM on 02/16/2018 -----------------------------------------  we have been able to draw blood and it is going well. Signed out to dr. Clearnce Hasten at the end of my shift.   ____________________________________________   FINAL CLINICAL IMPRESSION(S) / ED DIAGNOSES  Final diagnoses:  None      This chart was dictated  using voice recognition software.  Despite best efforts to proofread,  errors can occur which can change meaning.      Schuyler Amor, MD 02/16/18 1859    Schuyler Amor, MD 02/16/18 Dorthula Perfect    Schuyler Amor, MD 02/16/18 1932    Schuyler Amor, MD 02/16/18 2034    Schuyler Amor, MD 02/16/18 1572    Schuyler Amor, MD 02/16/18 2120

## 2018-02-16 NOTE — ED Provider Notes (Signed)
Signout from Dr. Burlene Arnt in this 58 year old female with polycythemia.  Plan is for patient to be phlebotomized 400 cc of blood then given a 400 cc bolus and then observed for 2 hours.  Physical Exam  BP 128/88   Pulse 82   Temp 98 F (36.7 C) (Oral)   Resp 18   Ht 5\' 5"  (1.651 m)   Wt 101.6 kg   SpO2 97%   BMI 37.27 kg/m  ----------------------------------------- 9:58 PM on 02/16/2018 -----------------------------------------   Physical Exam Called into the room secondary to the patient having already been phlebotomized 300 cc now feeling much improved.  No distress at this time.  Patient says that she has stood up and walks around the room without any lightheadedness or weakness. ED Course/Procedures     Procedures  MDM  Patient is refusing fluid bolus as well as observation.  She says that when she gets phlebotomized at [facility] that she never has a fluid bolus and she goes right home.  I discussed with her that this was against the advice of the consultants.  She is aware of the risks.  I gave her 2 cups of water.  She says that she will be watched by a friend who is a nurse at home and she will not engage in any strenuous activity for the rest of the night.  Says that she will also be calling [facility] tomorrow morning to discuss further with her hematologist.       Orbie Pyo, MD 02/16/18 2159

## 2018-02-16 NOTE — ED Notes (Addendum)
Able to draw off close to 300cc of blood.  Pt states that is it and she isn't getting stuck again. Pt states she is satisfied with what we drew off. Pt states she doesn't want the fluid afterwards. Will inform MD. Pt states she feels better and is ready to go home. Pt states she needs to get discharged asap so she can go eat at [facility].

## 2018-02-16 NOTE — ED Notes (Signed)
Patient transported to X-ray 

## 2018-02-16 NOTE — Discharge Instructions (Addendum)
Please make an appoint with your hematologist for tomorrow for recheck, return to the emergency room for any new or worrisome symptoms going chest pain shortness of breath or other concerns.  This would also include focal numbness or weakness or signs of stroke.  Even if you do feel better, we do strongly asked that you follow closely with your oncologist tomorrow.  We did talk also about possibly scanning you for low probability of PE, but you declined CT scan .  I do not think this is unreasonable but are some limitations that this places upon [place].  If you have chest pain or shortness of breath or change your mind it is something that you should be in general it is a good idea to get blood draws done through them rather than through the emergency department as this is not something we routinely do as you have seen.  However, if you have any significant concerns please return to the ER

## 2018-03-11 DIAGNOSIS — I5032 Chronic diastolic (congestive) heart failure: Secondary | ICD-10-CM | POA: Insufficient documentation

## 2018-03-20 DIAGNOSIS — M1990 Unspecified osteoarthritis, unspecified site: Secondary | ICD-10-CM | POA: Insufficient documentation

## 2018-05-25 DIAGNOSIS — Z0000 Encounter for general adult medical examination without abnormal findings: Secondary | ICD-10-CM | POA: Insufficient documentation

## 2018-05-25 DIAGNOSIS — E039 Hypothyroidism, unspecified: Secondary | ICD-10-CM | POA: Insufficient documentation

## 2018-06-25 DIAGNOSIS — R0602 Shortness of breath: Secondary | ICD-10-CM | POA: Insufficient documentation

## 2018-09-29 DIAGNOSIS — R5382 Chronic fatigue, unspecified: Secondary | ICD-10-CM | POA: Insufficient documentation

## 2018-11-03 DIAGNOSIS — M810 Age-related osteoporosis without current pathological fracture: Secondary | ICD-10-CM | POA: Insufficient documentation

## 2018-11-19 ENCOUNTER — Telehealth: Payer: Federal, State, Local not specified - PPO | Admitting: Physician Assistant

## 2018-11-19 DIAGNOSIS — R197 Diarrhea, unspecified: Secondary | ICD-10-CM

## 2018-11-19 NOTE — Progress Notes (Signed)
I spent 10 min on this E-Visit.  Based on what you shared with me, I feel your condition warrants further evaluation and I recommend that you be seen for a face to face office visit. DIARRHEA ACCOMPANIED BY ABDOMINAL PAIN COULD BE SERIOUS. PLEASE SEE MD AT EMERGENCY DEPT, OR YOUR MD OR CLINIC AS SOON AS POSSIBLE.  NOTE: If you entered your credit card information for this eVisit, you will not be charged. You may see a "hold" on your card for the $35 but that hold will drop off and you will not have a charge processed.  If you are having a true medical emergency please call 911.     For an urgent face to face visit, [facility] has four urgent care centers for your convenience:    NEW:  [facility]   [email or web address]/maps/[email or web address]%2c+[address]+[place]%2c+[place]+[address]                                                   [place], [place] [address] .  Monday - Friday 10 am - 6 pm    . [facility]    [phone]                  Get Driving Directions  [id number] [place], [place] [address] . 10 am to 8 pm Monday-Friday . 12 pm to 8 pm Saturday-Sunday   . [facility] [place]                  Get Driving Directions  [id number] [address], [place], [place] [address] . 8 am to 8 pm Monday-Friday . 9 am to 6 pm Saturday . 11 am to 6 pm Sunday     . [facility] [place]                  Get Driving Directions   [address] [place], [place] [address] . 8 am to 8 pm Monday-Friday . 8 am to 4 pm Saturday-Sunday    . [facility] [place]                    Get Driving Directions  [id number]  [address] [place], [place] [address]  . Monday-Friday, 12 PM to 6 PM    Your e-visit  answers were reviewed by a board certified advanced clinical practitioner to complete your personal care plan.  Thank you for using e-Visits.

## 2019-01-03 DIAGNOSIS — G4733 Obstructive sleep apnea (adult) (pediatric): Secondary | ICD-10-CM | POA: Insufficient documentation

## 2019-01-25 ENCOUNTER — Other Ambulatory Visit: Payer: Self-pay

## 2019-01-25 ENCOUNTER — Encounter: Payer: Self-pay | Admitting: Urology

## 2019-01-25 ENCOUNTER — Ambulatory Visit (INDEPENDENT_AMBULATORY_CARE_PROVIDER_SITE_OTHER): Payer: Medicare Other | Admitting: Urology

## 2019-01-25 VITALS — BP 152/83 | HR 88 | Ht 65.0 in | Wt 245.0 lb

## 2019-01-25 DIAGNOSIS — R350 Frequency of micturition: Secondary | ICD-10-CM

## 2019-01-25 LAB — URINALYSIS, COMPLETE
Bilirubin, UA: NEGATIVE
Glucose, UA: NEGATIVE
Ketones, UA: NEGATIVE
Leukocytes,UA: NEGATIVE
Nitrite, UA: NEGATIVE
Protein,UA: NEGATIVE
Specific Gravity, UA: >1.03 — ABNORMAL HIGH (ref 1.005–1.030)
Urobilinogen, Ur: 0.2 mg/dL (ref 0.2–1.0)
pH, UA: 5.5 (ref 5.0–7.5)

## 2019-01-25 LAB — MICROSCOPIC EXAMINATION: WBC, UA: NONE SEEN /hpf (ref 0–5)

## 2019-01-25 LAB — BLADDER SCAN AMB NON-IMAGING: Scan Result: 5

## 2019-01-25 MED ORDER — OXYBUTYNIN CHLORIDE 5 MG PO TABS: 5.0000 mg | ORAL_TABLET | Freq: Three times a day (TID) | ORAL | 2 refills | Status: DC | PRN

## 2019-01-25 NOTE — Progress Notes (Signed)
01/25/2019 3:07 PM   Helen Garcia Nov 13, 1960 [id number]  Referring provider: Glendon Axe, MD [address] [place],  [place] [address]  Chief Complaint  Patient presents with  . Urinary Frequency    HPI: 59 year old female referred for further evaluation of urinary frequency.  She has multiple medical issues including history of psoriatic arthritis on biologic agent and polycythemia vera.  She complains primary complaints of episodes them going on for 10 or more years.  They seem to be getting worse with time.  She reports episodes which used to be associated with severe loose stools that lasted several weeks at a time where she did have severe diarrhea as well as severe urinary frequency and urgency.  This was not associated with dysuria or hematuria.  She reports that she was always evaluated for urinary tract infections, sometimes treated for presumed infection but the cultures were always negative.  The symptoms seem to resolve spontaneously.  They happen 3-4 times per year.  No obvious alleviating or exacerbating symptoms other than associated loose stool.  More recently, she is gone her bowel habits under control.  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  Despite this, she keeps having episodes of severe urinary frequency.  Her most recent episode was in December.  She reports that she was paying 40 or 50 times per day and night.  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  She has never been evaluated by urology.  At baseline, she has no urinary symptoms.  She voids 6-7 times per 24-hour period without significant nocturia urgency or frequency.  She has no leakage.  She is status post hysterectomy with oophorectomy for benign uterine fibroid Freud's about 20 years ago.  She recently  underwent a sleep study which did indicate obstructive sleep apnea and she slept with her CPAP for the first time last night.  Urinalysis from 11/2018 - other than for bacteria.  Urine culture grew mixed flora.  No issues with urinary tract infections.  Today, she is asymptomatic.   PMH: Past Medical History:  Diagnosis Date  . Arthritis    Psoriatic, Osteoarthritis  . Cavernous hemangioma of liver 2017  . Cervical spondylosis   . Cervical spondylosis   . Depression   . Erythrocytosis   . GERD (gastroesophageal reflux disease)   . Glaucoma   . Hypothyroidism   . IBS (irritable bowel syndrome)   . 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 PONV (postoperative nausea and vomiting)   . Uveitis   . Vasovagal episode     Surgical History: Past Surgical History:  Procedure Laterality Date  . ABDOMINAL HYSTERECTOMY    . APPENDECTOMY    . CHOLECYSTECTOMY    . EYE SURGERY  1974   eye musckle  . KNEE ARTHROSCOPY WITH MEDIAL MENISECTOMY Left 03/12/2017   Procedure: KNEE ARTHROSCOPY WITH MEDIAL MENISECTOMY;  Surgeon: Hessie Knows, MD;  Location: [facility] ORS;  Service: Orthopedics;  Laterality: Left;  . TONSILLECTOMY      Home Medications:  Allergies as of 01/25/2019      Reactions   Abatacept Hives, Itching   Codeine Nausea And Vomiting, Other (See Comments)   GI upset   Tomato Rash   Upset stomach      Medication List       Accurate as of  January 25, 2019 11:59 PM. If you have any questions, ask your nurse or doctor.        aspirin 81 MG chewable tablet Chew 81 mg by mouth daily.   clotrimazole 1 % cream Commonly known as: LOTRIMIN Apply 1 application topically daily as needed (yeast infections).   HYDROcodone-acetaminophen 5-325 MG tablet Commonly known as: Norco Take 1 tablet by mouth every 6 (six) hours as needed for moderate pain.   levothyroxine 100 MCG tablet Commonly known as: SYNTHROID Take 100 mcg by mouth daily before breakfast.   multivitamin with minerals  Tabs tablet Take 1 tablet by mouth daily.   nabumetone 750 MG tablet Commonly known as: RELAFEN Take 750 mg by mouth 2 (two) times daily.   oxybutynin 5 MG tablet Commonly known as: DITROPAN Take 1 tablet (5 mg total) by mouth every 8 (eight) hours as needed for bladder spasms. Started by: Hollice Espy, MD   PROBIOTIC DAILY PO Take 1 capsule by mouth daily.   Tofacitinib Citrate 5 MG Tabs Take 5 mg by mouth 2 (two) times daily. Leonarda Salon   Vitamin D3 50 MCG (2000 UT) capsule Take 2,000 Units by mouth daily.       Allergies:  Allergies  Allergen Reactions  . Abatacept Hives and Itching  . Codeine Nausea And Vomiting and Other (See Comments)    GI upset  . Tomato Rash    Upset stomach    Family History: Family History  Problem Relation Age of Onset  . Breast cancer Neg Hx     Social History:  reports that she has quit smoking. She has never used smokeless tobacco. She reports current drug use. Drug: Marijuana. She reports that she does not drink alcohol.  ROS: UROLOGY Frequent Urination?: Yes Hard to postpone urination?: Yes Burning/pain with urination?: No Get up at night to urinate?: Yes Leakage of urine?: No Urine stream starts and stops?: No Trouble starting stream?: No Do you have to strain to urinate?: No Blood in urine?: No Urinary tract infection?: No Sexually transmitted disease?: No Injury to kidneys or bladder?: No Painful intercourse?: No Weak stream?: No Currently pregnant?: No Vaginal bleeding?: No Last menstrual period?: n  Gastrointestinal Nausea?: No Vomiting?: No Indigestion/heartburn?: No Diarrhea?: No Constipation?: No  Constitutional Fever: No Night sweats?: No Weight loss?: No Fatigue?: Yes  Skin Skin rash/lesions?: No Itching?: No  Eyes Blurred vision?: No Double vision?: No  Ears/Nose/Throat Sore throat?: No Sinus problems?: No  Hematologic/Lymphatic Swollen glands?: No Easy bruising?:  No  Cardiovascular Leg swelling?: No Chest pain?: No  Respiratory Cough?: No Shortness of breath?: No  Endocrine Excessive thirst?: No  Musculoskeletal Back pain?: No Joint pain?: Yes  Neurological Headaches?: No Dizziness?: Yes  Psychologic Depression?: No Anxiety?: No  Physical Exam: BP (!) 152/83   Pulse 88   Ht 5\' 5"  (1.651 m)   Wt 245 lb (111.1 kg)   BMI 40.77 kg/m   Constitutional:  Alert and oriented, No acute distress. HEENT: [place] AT, moist mucus membranes.  Trachea midline, no masses. Cardiovascular: No clubbing, cyanosis, or edema. Respiratory: Normal respiratory effort, no increased work of breathing. GI: Abdomen is soft, nontender, nondistended, no abdominal masses, obese Skin: No rashes, bruises or suspicious lesions. Neurologic: Grossly intact, no focal deficits, moving all 4 extremities. Psychiatric: Normal mood and affect.  Laboratory Data: Lab Results  Component Value Date   WBC 7.9 02/16/2018   HGB 16.5 (H) 02/16/2018   HCT 49.5 (H) 02/16/2018   MCV 89.5 02/16/2018  PLT 270 02/16/2018    Lab Results  Component Value Date   CREATININE 0.78 02/16/2018     Urinalysis Results for orders placed or performed in visit on 01/25/19  Microscopic Examination   URINE  Result Value Ref Range   WBC, UA None seen 0 - 5 /hpf   RBC 0-2 0 - 2 /hpf   Epithelial Cells (non renal) 0-10 0 - 10 /hpf   Bacteria, UA Few None seen/Few  Urinalysis, Complete  Result Value Ref Range   Specific Gravity, UA >1.030 (H) 1.005 - 1.030   pH, UA 5.5 5.0 - 7.5   Color, UA Yellow Yellow   Appearance Ur Cloudy (A) Clear   Leukocytes,UA Negative Negative   Protein,UA Negative Negative/Trace   Glucose, UA Negative Negative   Ketones, UA Negative Negative   RBC, UA Trace (A) Negative   Bilirubin, UA Negative Negative   Urobilinogen, Ur 0.2 0.2 - 1.0 mg/dL   Nitrite, UA Negative Negative   Microscopic Examination See below:   Bladder Scan (Post Void Residual) in  office  Result Value Ref Range   Scan Result 5      Assessment & Plan:    1. Urinary frequency Episodic occurrences of severe urinary frequency urgency nocturia not associated with infection  She is asymptomatic today  I recommended that she present to our office if and when she is symptomatic in order to ensure that she is emptying her bladder, rule out additional bladder conditions such as atypical infection with mycoplasma Ureaplasma.  I also asked her to keep a voiding diary during these episodes to help document in addition to the above, we discussed importance of good bowel hygiene which she seems to be doing better with at this point time.  Given her a prescription for oxybutynin 5 mg 3 times daily as needed to help control her symptoms during these crises.  We discussed possible side effects of dry eyes dry mouth and constipation.  If this is helpful, we can change it to a more extended version.  She does not need these medications currently.  Prescription was given primary to hold and have available as needed.  Etiology of these episodes is still unclear, will require further evaluation when she is symptomatic.  She will return during these times.  - Bladder Scan (Post Void Residual) in office - Urinalysis, Complete   Return if symptoms worsen or fail to improve.  Hollice Espy, MD  [facility] [address] [place], [place] [address] [phone]  I spent 30 min with this patient of which greater than 50% was spent in counseling and coordination of care with the patient.

## 2019-01-25 NOTE — Patient Instructions (Signed)
Follow up as needed for flares  Tracking Your Bladder Symptoms    Patient Name:___________________________________________________   Sample: Day   Daytime Voids  Nighttime Voids Urgency for the Day(0-4) Number of Accidents Beverage Comments  Monday IIII II 2 I Water IIII Coffee  I      Week Starting:____________________________________   Day Daytime  Voids Nighttime  Voids Urgency for  The Day(0-4) Number of Accidents Beverages Comments                                                           This week my symptoms were:  O much better  O better O the same O worse

## 2019-02-18 ENCOUNTER — Ambulatory Visit: Payer: Federal, State, Local not specified - PPO

## 2019-02-19 ENCOUNTER — Ambulatory Visit: Payer: Federal, State, Local not specified - PPO

## 2019-05-04 DIAGNOSIS — M25352 Other instability, left hip: Secondary | ICD-10-CM | POA: Insufficient documentation

## 2019-05-05 ENCOUNTER — Other Ambulatory Visit (HOSPITAL_COMMUNITY): Payer: Self-pay | Admitting: Internal Medicine

## 2019-05-05 ENCOUNTER — Other Ambulatory Visit: Payer: Self-pay | Admitting: Internal Medicine

## 2019-05-05 DIAGNOSIS — M25352 Other instability, left hip: Secondary | ICD-10-CM

## 2019-05-24 ENCOUNTER — Ambulatory Visit
Admission: RE | Admit: 2019-05-24 | Discharge: 2019-05-24 | Disposition: A | Discharge status: Home / Self Care | Payer: Medicare Other | Source: Ambulatory Visit | Attending: Internal Medicine | Admitting: Internal Medicine

## 2019-05-24 ENCOUNTER — Other Ambulatory Visit: Payer: Self-pay

## 2019-05-24 DIAGNOSIS — M25352 Other instability, left hip: Secondary | ICD-10-CM | POA: Diagnosis present

## 2020-03-05 ENCOUNTER — Other Ambulatory Visit: Payer: Self-pay | Admitting: Family Medicine

## 2020-03-05 DIAGNOSIS — Z1231 Encounter for screening mammogram for malignant neoplasm of breast: Secondary | ICD-10-CM

## 2020-03-23 IMAGING — CR DG CHEST 2V
1 series · 2 of 2 positions shown · non-contrast
Comparison: Chest CT 07/18/2016

CLINICAL DATA: Shortness of breath and weakness for 3 days.

EXAM:
CHEST - 2 VIEW

[Series 1: dg chest 2 view · 0.14mm/px · 2 of 2 slices shown]
[im 1/2]
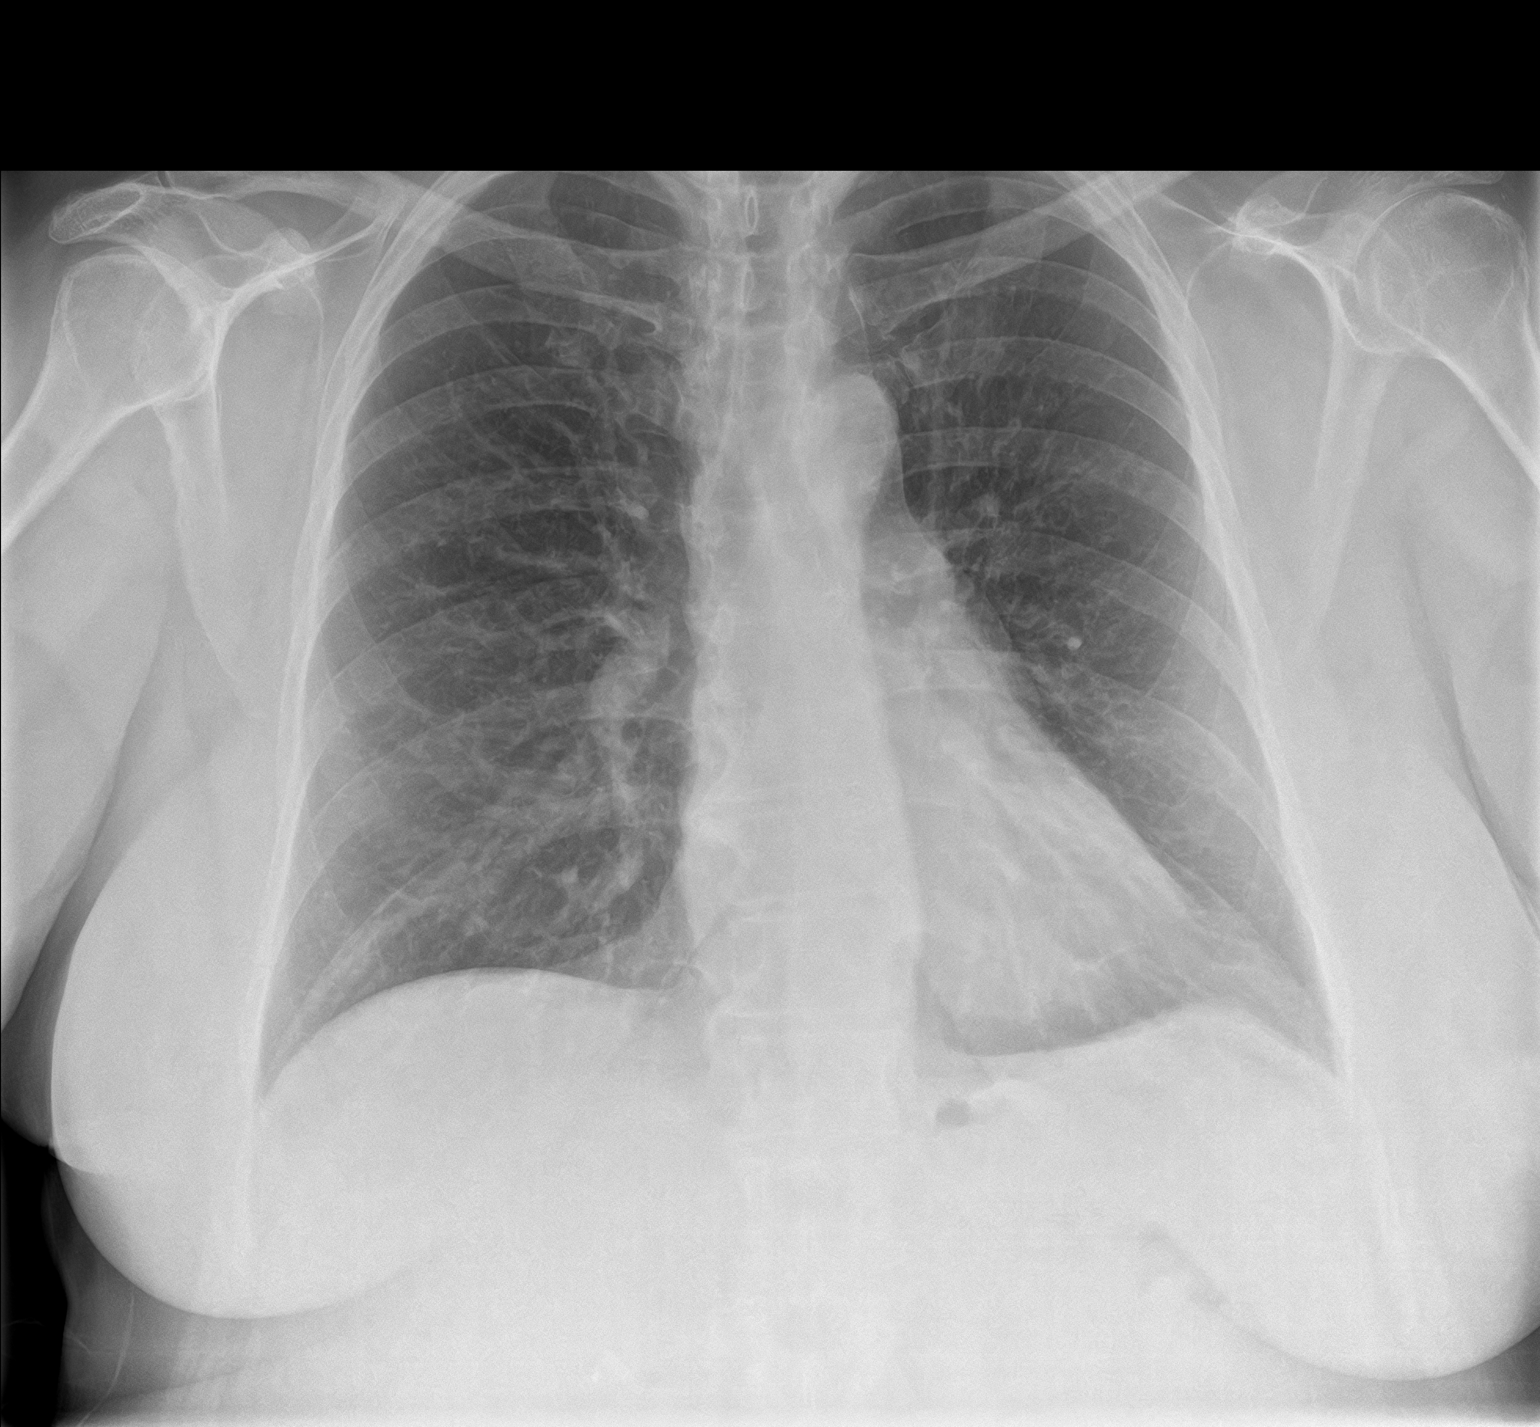
[im 2/2]
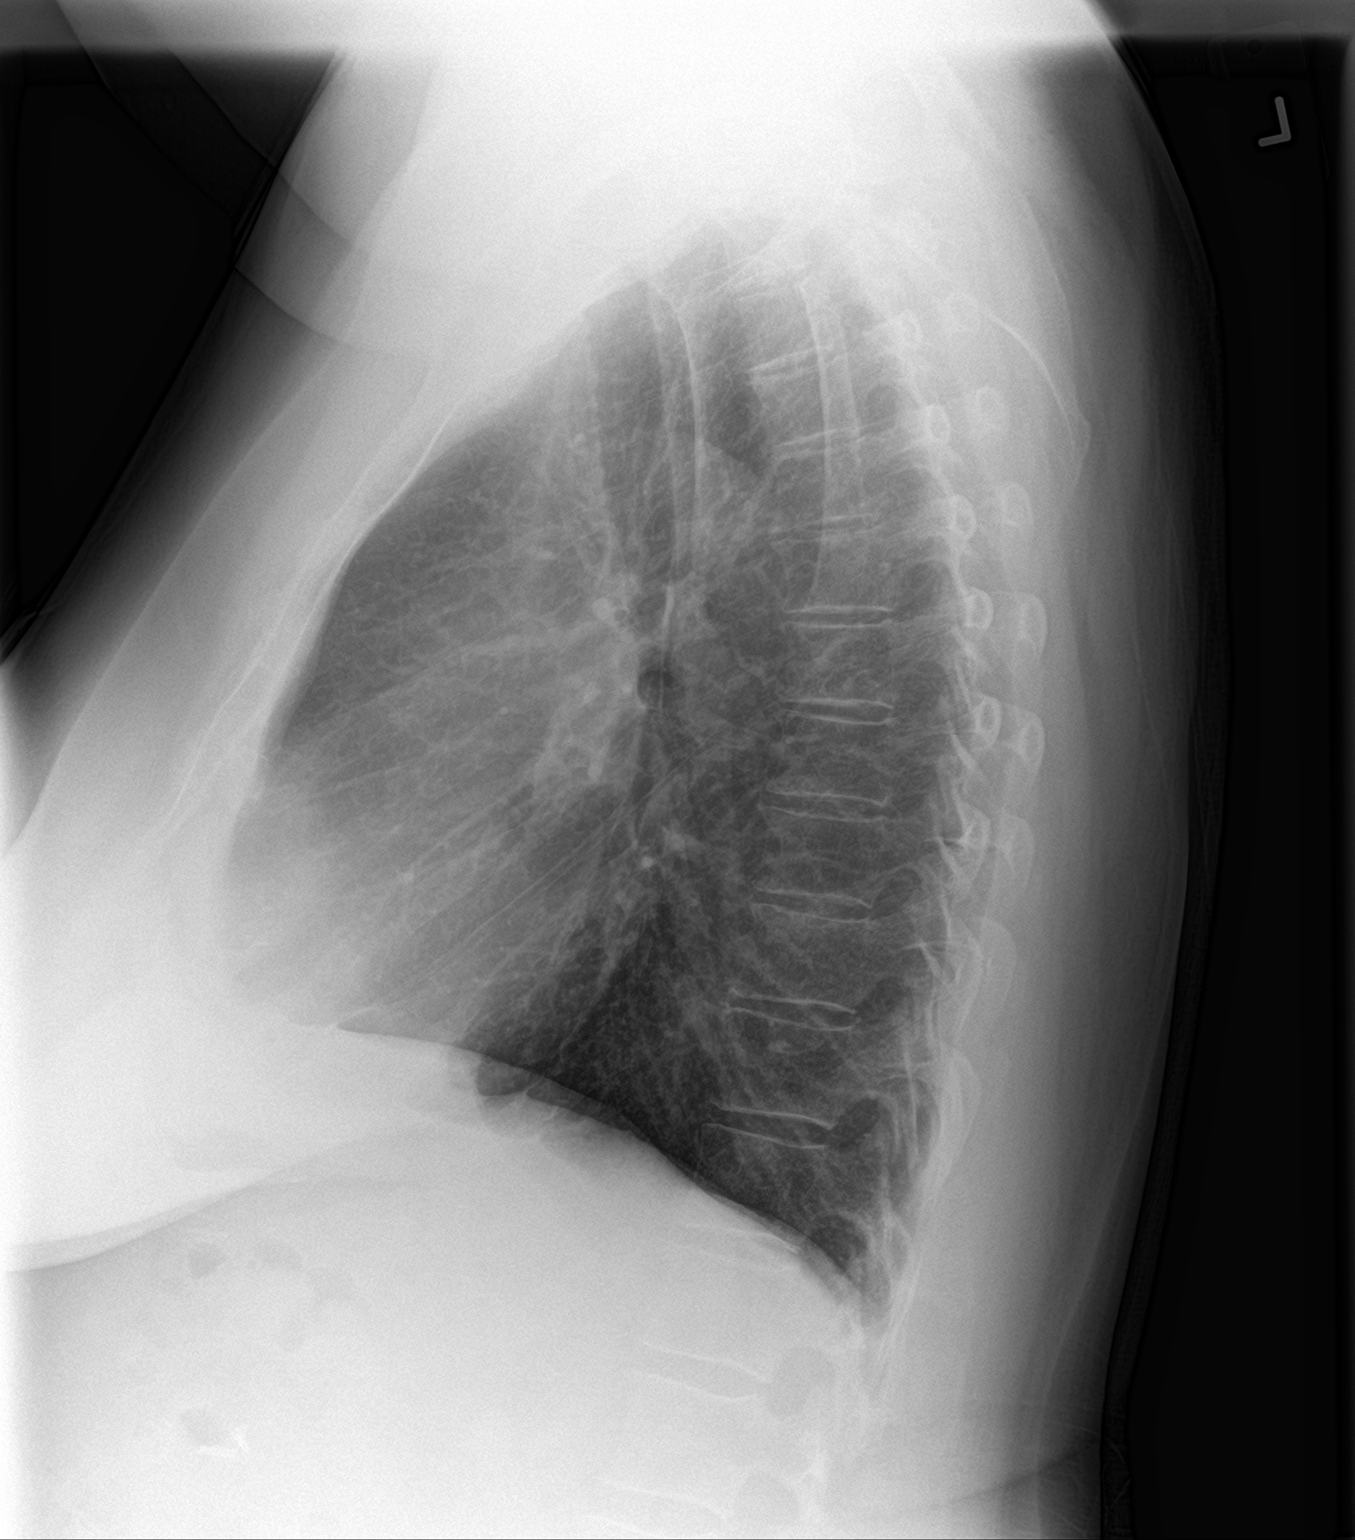

[2 of 2 positions shown; findings below may reference images not displayed]

FINDINGS: The lungs are clear without focal pneumonia, edema, pneumothorax or
pleural effusion. The cardiopericardial silhouette is within normal
limits for size. The visualized bony structures of the thorax are
intact.
IMPRESSION: No active cardiopulmonary disease.

## 2020-03-28 ENCOUNTER — Other Ambulatory Visit: Payer: Self-pay

## 2020-03-28 ENCOUNTER — Ambulatory Visit
Admission: RE | Admit: 2020-03-28 | Discharge: 2020-03-28 | Disposition: A | Discharge status: Home / Self Care | Payer: Medicare Other | Source: Ambulatory Visit | Attending: Family Medicine | Admitting: Family Medicine

## 2020-03-28 DIAGNOSIS — Z1231 Encounter for screening mammogram for malignant neoplasm of breast: Secondary | ICD-10-CM | POA: Diagnosis present

## 2020-05-21 ENCOUNTER — Inpatient Hospital Stay: Payer: Medicare Other | Attending: Oncology | Admitting: Oncology

## 2020-05-21 ENCOUNTER — Inpatient Hospital Stay: Payer: Medicare Other

## 2020-05-21 ENCOUNTER — Other Ambulatory Visit: Payer: Self-pay

## 2020-05-21 ENCOUNTER — Encounter: Payer: Self-pay | Admitting: Oncology

## 2020-05-21 VITALS — BP 137/82 | HR 75 | Temp 97.7°F | Resp 20 | Wt 237.0 lb

## 2020-05-21 DIAGNOSIS — F1290 Cannabis use, unspecified, uncomplicated: Secondary | ICD-10-CM | POA: Diagnosis not present

## 2020-05-21 DIAGNOSIS — F32A Depression, unspecified: Secondary | ICD-10-CM | POA: Diagnosis not present

## 2020-05-21 DIAGNOSIS — K219 Gastro-esophageal reflux disease without esophagitis: Secondary | ICD-10-CM | POA: Diagnosis not present

## 2020-05-21 DIAGNOSIS — Z791 Long term (current) use of non-steroidal anti-inflammatories (NSAID): Secondary | ICD-10-CM | POA: Diagnosis not present

## 2020-05-21 DIAGNOSIS — E039 Hypothyroidism, unspecified: Secondary | ICD-10-CM | POA: Insufficient documentation

## 2020-05-21 DIAGNOSIS — G4730 Sleep apnea, unspecified: Secondary | ICD-10-CM | POA: Insufficient documentation

## 2020-05-21 DIAGNOSIS — L4050 Arthropathic psoriasis, unspecified: Secondary | ICD-10-CM | POA: Insufficient documentation

## 2020-05-21 DIAGNOSIS — D751 Secondary polycythemia: Secondary | ICD-10-CM | POA: Insufficient documentation

## 2020-05-21 DIAGNOSIS — Z87891 Personal history of nicotine dependence: Secondary | ICD-10-CM | POA: Diagnosis not present

## 2020-05-21 DIAGNOSIS — Z7982 Long term (current) use of aspirin: Secondary | ICD-10-CM | POA: Insufficient documentation

## 2020-05-21 DIAGNOSIS — Z79899 Other long term (current) drug therapy: Secondary | ICD-10-CM | POA: Diagnosis not present

## 2020-05-21 DIAGNOSIS — G8929 Other chronic pain: Secondary | ICD-10-CM | POA: Diagnosis not present

## 2020-05-21 NOTE — Progress Notes (Signed)
Hematology/Oncology Consult note [facility] Telephone:([phone] [phone]  Patient Care Team: Dion Body, MD as PCP - General (Family Medicine)   Name of the patient: Helen Garcia  [id number]  1960-08-27    Reason for referral-polycythemia   Referring physician-Dr. Netty Starring  Date of visit: 05/21/20   History of presenting illness- Patient is a 60 year old female with a past medical history significant for hypothyroidism, depression, GERD who has been referred to [place] for polycythemia.  Most recent CBC from 04/09/2020 showed white count of 8, H&H of 15.9/28 and a platelet count of 272.  Looking back at her prior CBCs her hemoglobin has always been high between 15-16.  No clear increasing trend.  CMP within normal limits.  Patient has seen [facility] in the past as well as Dr. Sonny Masters in [place].  Patient also reports seeing [facility] in the past.  She had a JAK2 testing done at that time which was negative.  Exon 12 mutation testing also negative per outside record review.  She is also had testing for obstructive sleep apnea back in 2014 she has a history of psoriasis and psoriatic arthritis and is on biologicals for that.  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  In the past despite having secondary polycythemia she has undergone phlebotomies to keep her hematocrit less than 43 for unclear reasons.  More recently patient was found to have a low ferritin of 16.  She has not had a phlebotomy for over 6 months now.  Patient is mainly here for a second opinion to see if it would be justified to offer phlebotomy when hematocrit is greater than 50 or continue to have phlebotomies to keep a hematocrit of less than 43.  Has chronic joint pain and she is currently on Cosentyx for her psoriatic arthritis.  He reports longstanding fatigue.  Denies any blood loss in her stool  or urine  ECOG PS- 1  Pain scale- 43   Review of systems- Review of Systems  Constitutional: Positive for malaise/fatigue. Negative for chills, fever and weight loss.  HENT: Negative for congestion, ear discharge and nosebleeds.   Eyes: Negative for blurred vision.  Respiratory: Negative for cough, hemoptysis, sputum production, shortness of breath and wheezing.   Cardiovascular: Negative for chest pain, palpitations, orthopnea and claudication.  Gastrointestinal: Negative for abdominal pain, blood in stool, constipation, diarrhea, heartburn, melena, nausea and vomiting.  Genitourinary: Negative for dysuria, flank pain, frequency, hematuria and urgency.  Musculoskeletal: Positive for joint pain. Negative for back pain and myalgias.  Skin: Negative for rash.  Neurological: Negative for dizziness, tingling, focal weakness, seizures, weakness and headaches.  Endo/Heme/Allergies: Does not bruise/bleed easily.  Psychiatric/Behavioral: Negative for depression and suicidal ideas. The patient does not have insomnia.     Allergies  Allergen Reactions  . Abatacept Hives and Itching  . Codeine Nausea And Vomiting and Other (See Comments)    GI upset  . Tomato Rash    Upset stomach    There are no problems to display for this patient.    Past Medical History:  Diagnosis Date  . Arthritis    Psoriatic, Osteoarthritis  . Cavernous hemangioma of liver 2017  . Cervical spondylosis   . Cervical spondylosis   . Depression   . Erythrocytosis   . GERD (gastroesophageal reflux disease)   . Glaucoma   . Hypothyroidism   . IBS (irritable bowel syndrome)   .  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 PONV (postoperative nausea and vomiting)   . Uveitis   . Vasovagal episode      Past Surgical History:  Procedure Laterality Date  . ABDOMINAL HYSTERECTOMY    . APPENDECTOMY    . CHOLECYSTECTOMY    . EYE SURGERY  1974   eye musckle  . KNEE ARTHROSCOPY WITH MEDIAL MENISECTOMY Left 03/12/2017    Procedure: KNEE ARTHROSCOPY WITH MEDIAL MENISECTOMY;  Surgeon: Hessie Knows, MD;  Location: [facility] ORS;  Service: Orthopedics;  Laterality: Left;  . TONSILLECTOMY      Social History   Socioeconomic History  . Marital status: Single    Spouse name: Not on file  . Number of children: Not on file  . Years of education: Not on file  . Highest education level: Not on file  Occupational History  . Not on file  Tobacco Use  . Smoking status: Former Research scientist (life sciences)  . Smokeless tobacco: Never Used  Vaping Use  . Vaping Use: Never used  Substance and Sexual Activity  . Alcohol use: No  . Drug use: Yes    Types: Marijuana  . Sexual activity: Not on file  Other Topics Concern  . Not on file  Social History Narrative  . Not on file   Social Determinants of Health   Financial Resource Strain: Not on file  Food Insecurity: Not on file  Transportation Needs: Not on file  Physical Activity: Not on file  Stress: Not on file  Social Connections: Not on file  Intimate Partner Violence: Not on file     Family History  Problem Relation Age of Onset  . Breast cancer Neg Hx      Current Outpatient Medications:  .  aspirin 81 MG chewable tablet, Chew 81 mg by mouth daily., Disp: , Rfl:  .  Cholecalciferol (VITAMIN D3) 2000 units capsule, Take 2,000 Units by mouth daily., Disp: , Rfl:  .  clotrimazole (LOTRIMIN) 1 % cream, Apply 1 application topically daily as needed (yeast infections)., Disp: , Rfl:  .  HYDROcodone-acetaminophen (NORCO) 5-325 MG tablet, Take 1 tablet by mouth every 6 (six) hours as needed for moderate pain., Disp: 30 tablet, Rfl: 0 .  levothyroxine (SYNTHROID, LEVOTHROID) 100 MCG tablet, Take 100 mcg by mouth daily before breakfast. , Disp: , Rfl:  .  Multiple Vitamin (MULTIVITAMIN WITH MINERALS) TABS tablet, Take 1 tablet by mouth daily., Disp: , Rfl:  .  nabumetone (RELAFEN) 750 MG tablet, Take 750 mg by mouth 2 (two) times daily., Disp: , Rfl:  .  oxybutynin (DITROPAN) 5  MG tablet, Take 1 tablet (5 mg total) by mouth every 8 (eight) hours as needed for bladder spasms., Disp: 30 tablet, Rfl: 2 .  Probiotic Product (PROBIOTIC DAILY PO), Take 1 capsule by mouth daily., Disp: , Rfl:  .  Tofacitinib Citrate 5 MG TABS, Take 5 mg by mouth 2 (two) times daily. Leonarda Salon, Disp: , Rfl:    Physical exam:  Vitals:   05/21/20 1054  BP: 137/82  Pulse: 75  Resp: 20  Temp: 97.7 F (36.5 C)  TempSrc: Tympanic  SpO2: 98%  Weight: 237 lb (107.5 kg)   Physical Exam HENT:     Head: Atraumatic.  Cardiovascular:     Rate and Rhythm: Normal rate and regular rhythm.     Heart sounds: Normal heart sounds.  Pulmonary:     Effort: Pulmonary effort is normal.     Breath sounds: Normal breath sounds.  Abdominal:     General: Bowel sounds are normal.     Palpations: Abdomen is soft.     Comments: No palpable hepatosplenomegaly  Skin:    General: Skin is warm and dry.  Neurological:     Mental Status: She is alert and oriented to person, place, and time.        CMP Latest Ref Rng & Units 02/16/2018  Glucose 70 - 99 mg/dL 95  BUN 6 - 20 mg/dL 14  Creatinine 0.44 - 1.00 mg/dL 0.78  Sodium 135 - 145 mmol/L 139  Potassium 3.5 - 5.1 mmol/L 4.2  Chloride 98 - 111 mmol/L 104  CO2 22 - 32 mmol/L 28  Calcium 8.9 - 10.3 mg/dL 10.0  Total Protein 6.5 - 8.1 g/dL 7.2  Total Bilirubin 0.3 - 1.2 mg/dL 0.5  Alkaline Phos 38 - 126 U/L 114  AST 15 - 41 U/L 20  ALT 0 - 44 U/L 23   CBC Latest Ref Rng & Units 02/16/2018  WBC 4.0 - 10.5 K/uL 7.9  Hemoglobin 12.0 - 15.0 g/dL 16.5(H)  Hematocrit 36.0 - 46.0 % 49.5(H)  Platelets 150 - 400 K/uL 270     Assessment and plan- Patient is a 60 y.o. 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  JAK2 and exon 12 mutation testing was negative and her polycythemia was deemed to be secondary possibly secondary  to her sleep apnea.  At some point her biologic therapy for psoriatic arthritis was also thought to be the reason.  In the past she has undergone phlebotomy for hematocrit greater than 43 despite having a secondary etiology.  It is unclear if she has had a complete myeloid sequencing panel including CAL R and MPL mutation checked.  Regardless her hematocrit has remained stable between 45-48 over the last 1 year.  She has not undergone a phlebotomy for over 6 months.  Explained to the patient that it is a JAK2 positivity and polycythemia vera that confers increased risk of thromboembolic episodes and in primarily polycythemia vera cases it is reasonable to keep her hematocrit less than 42.  For secondary polycythemia-there is no increased risk of thrombosis when hematocrit is less than 50-55.  I therefore agree that patient does not require phlebotomy unless her hematocrit is greater than 50.  Patient states that she has undergone testing in the past which tells that she would be at an increased risk of developing a JAK2 mutation in the future.  She has had testing done last year as well which was negative.  At this time no repeat testing is indicated unless there is a consistent increase in her H&H.  Will repeat CBC and ferritin in 3 months and 6 months and I will see her back in 6 months.  Patient states that her recent ferritin was 16 but the value that I see in our system was 19 which was not low.  Regardless patient does not require any iron supplementation at this time as it would potentially increase her hematocrit and lead to further phlebotomies.   Thank you for this kind referral and the opportunity to participate in the care of this patient   Visit Diagnosis 1. Polycythemia     Dr. Randa Evens, MD, MPH [facility] at [facility] [phone] 05/21/2020

## 2020-05-24 ENCOUNTER — Other Ambulatory Visit: Payer: Self-pay | Admitting: Orthopedic Surgery

## 2020-05-24 DIAGNOSIS — G8929 Other chronic pain: Secondary | ICD-10-CM

## 2020-05-24 DIAGNOSIS — M19071 Primary osteoarthritis, right ankle and foot: Secondary | ICD-10-CM

## 2020-05-24 DIAGNOSIS — M958 Other specified acquired deformities of musculoskeletal system: Secondary | ICD-10-CM

## 2020-06-08 ENCOUNTER — Ambulatory Visit
Admission: RE | Admit: 2020-06-08 | Discharge: 2020-06-08 | Disposition: A | Discharge status: Home / Self Care | Payer: Medicare Other | Source: Ambulatory Visit | Attending: Orthopedic Surgery | Admitting: Orthopedic Surgery

## 2020-06-08 ENCOUNTER — Other Ambulatory Visit: Payer: Self-pay

## 2020-06-08 DIAGNOSIS — M25571 Pain in right ankle and joints of right foot: Secondary | ICD-10-CM | POA: Diagnosis present

## 2020-06-08 DIAGNOSIS — M958 Other specified acquired deformities of musculoskeletal system: Secondary | ICD-10-CM | POA: Insufficient documentation

## 2020-06-08 DIAGNOSIS — M19071 Primary osteoarthritis, right ankle and foot: Secondary | ICD-10-CM | POA: Diagnosis present

## 2020-06-08 DIAGNOSIS — G8929 Other chronic pain: Secondary | ICD-10-CM | POA: Diagnosis present

## 2020-07-26 ENCOUNTER — Other Ambulatory Visit: Payer: Medicare Other

## 2020-08-02 ENCOUNTER — Other Ambulatory Visit: Payer: Self-pay | Admitting: Podiatry

## 2020-08-08 ENCOUNTER — Other Ambulatory Visit: Payer: Self-pay

## 2020-08-08 ENCOUNTER — Encounter
Admission: RE | Admit: 2020-08-08 | Discharge: 2020-08-08 | Disposition: A | Discharge status: Home / Self Care | Payer: Medicare Other | Source: Ambulatory Visit | Attending: Podiatry | Admitting: Podiatry

## 2020-08-08 HISTORY — DX: Morbid (severe) obesity due to excess calories: E66.01

## 2020-08-08 HISTORY — DX: Sleep apnea, unspecified: G47.30

## 2020-08-08 HISTORY — DX: Fatty (change of) liver, not elsewhere classified: K76.0

## 2020-08-08 HISTORY — DX: Personal history of other diseases of the digestive system: Z87.19

## 2020-08-08 NOTE — Patient Instructions (Addendum)
Your procedure is scheduled on: Friday, August 12 Report to the Registration Desk on the 1st floor of the [facility]. To find out your arrival time, please call [phone] between 1PM - 3PM on: Thursday, August 11  REMEMBER: Instructions that are not followed completely may result in serious medical risk, up to and including death; or upon the discretion of your surgeon and anesthesiologist your surgery may need to be rescheduled.  Do not eat food after midnight the night before surgery.  No gum chewing, lozengers or hard candies.  You may however, drink CLEAR liquids up to 2 hours before you are scheduled to arrive for your surgery. Do not drink anything within 2 hours of your scheduled arrival time.  Clear liquids include: - water  - apple juice without pulp - gatorade (not RED, PURPLE, OR BLUE) - black coffee or tea (Do NOT add milk or creamers to the coffee or tea) Do NOT drink anything that is not on this list.  In addition, your doctor has ordered for you to drink the provided  Ensure Pre-Surgery Clear Carbohydrate Drink  Drinking this carbohydrate drink up to two hours before surgery helps to reduce insulin resistance and improve patient outcomes. Please complete drinking 2 hours prior to scheduled arrival time.  TAKE THESE MEDICATIONS THE MORNING OF SURGERY WITH A SIP OF WATER:  Synthroid Oxycodone if needed for pain  One week prior to surgery: starting August 5 Stop aspirin and Anti-inflammatories (NSAIDS) such as Advil, Aleve, Ibuprofen, Motrin, Naproxen, Naprosyn and Aspirin based products such as Excedrin, Goodys Powder, BC Powder. Stop ANY OVER THE COUNTER supplements until after surgery. (Vitamin D, multi-vitamin) You may however, continue to take Tylenol if needed for pain up until the day of surgery.  No Alcohol for 24 hours before or after surgery.  No Smoking including e-cigarettes for 24 hours prior to surgery.  No chewable tobacco products for at least 6  hours prior to surgery.  No nicotine patches on the day of surgery.  Do not use any "recreational" drugs for at least a week prior to your surgery.  Please be advised that the combination of cocaine and anesthesia may have negative outcomes, up to and including death. If you test positive for cocaine, your surgery will be cancelled.  On the morning of surgery brush your teeth with toothpaste and water, you may rinse your mouth with mouthwash if you wish. Do not swallow any toothpaste or mouthwash.  Do not wear jewelry, make-up, hairpins, clips or nail polish.  Do not wear lotions, powders, or perfumes.   Do not shave body from the neck down 48 hours prior to surgery just in case you cut yourself which could leave a site for infection.  Also, freshly shaved skin may become irritated if using the CHG soap.  Contact lenses, hearing aids and dentures may not be worn into surgery.  Do not bring valuables to the hospital. [facility] is not responsible for any missing/lost belongings or valuables.   Use CHG Soap as directed on instruction sheet.  Bring your C-PAP to the hospital with you in case you may have to spend the night.   Notify your doctor if there is any change in your medical condition (cold, fever, infection).  Wear comfortable clothing (specific to your surgery type) to the hospital.  After surgery, you can help prevent lung complications by doing breathing exercises.  Take deep breaths and cough every 1-2 hours. Your doctor may order a device called  an Chiropodist to help you take deep breaths.  If you are being discharged the day of surgery, you will not be allowed to drive home. You will need a responsible adult (18 years or older) to drive you home and stay with you that night.   If you are taking public transportation, you will need to have a responsible adult (18 years or older) with you. Please confirm with your physician that it is acceptable to use public  transportation.   Please call the [facility] Dept. at [phone] if you have any questions about these instructions.  Surgery Visitation Policy:  Patients undergoing a surgery or procedure may have one family member or support person with them as long as that person is not COVID-19 positive or experiencing its symptoms.  That person may remain in the waiting area during the procedure.

## 2020-08-17 ENCOUNTER — Ambulatory Visit
Admission: RE | Admit: 2020-08-17 | Discharge: 2020-08-17 | Disposition: A | Discharge status: Home / Self Care | Payer: Medicare Other | Attending: Podiatry | Admitting: Podiatry

## 2020-08-17 ENCOUNTER — Ambulatory Visit: Payer: Medicare Other | Admitting: Anesthesiology

## 2020-08-17 ENCOUNTER — Other Ambulatory Visit: Payer: Self-pay

## 2020-08-17 ENCOUNTER — Encounter: Payer: Self-pay | Admitting: Podiatry

## 2020-08-17 ENCOUNTER — Ambulatory Visit: Payer: Medicare Other

## 2020-08-17 ENCOUNTER — Encounter
Admission: RE | Disposition: A | Discharge status: Home / Self Care | Payer: Self-pay | Source: Home / Self Care | Attending: Podiatry

## 2020-08-17 DIAGNOSIS — M659 Synovitis and tenosynovitis, unspecified: Secondary | ICD-10-CM | POA: Insufficient documentation

## 2020-08-17 DIAGNOSIS — M899 Disorder of bone, unspecified: Secondary | ICD-10-CM | POA: Insufficient documentation

## 2020-08-17 DIAGNOSIS — Z885 Allergy status to narcotic agent status: Secondary | ICD-10-CM | POA: Diagnosis not present

## 2020-08-17 DIAGNOSIS — Z7982 Long term (current) use of aspirin: Secondary | ICD-10-CM | POA: Diagnosis not present

## 2020-08-17 DIAGNOSIS — M25371 Other instability, right ankle: Secondary | ICD-10-CM | POA: Diagnosis not present

## 2020-08-17 DIAGNOSIS — M21961 Unspecified acquired deformity of right lower leg: Secondary | ICD-10-CM | POA: Insufficient documentation

## 2020-08-17 DIAGNOSIS — Z888 Allergy status to other drugs, medicaments and biological substances status: Secondary | ICD-10-CM | POA: Insufficient documentation

## 2020-08-17 DIAGNOSIS — Z87891 Personal history of nicotine dependence: Secondary | ICD-10-CM | POA: Diagnosis not present

## 2020-08-17 DIAGNOSIS — Z79890 Hormone replacement therapy (postmenopausal): Secondary | ICD-10-CM | POA: Insufficient documentation

## 2020-08-17 DIAGNOSIS — Z79899 Other long term (current) drug therapy: Secondary | ICD-10-CM | POA: Insufficient documentation

## 2020-08-17 DIAGNOSIS — Z91018 Allergy to other foods: Secondary | ICD-10-CM | POA: Insufficient documentation

## 2020-08-17 DIAGNOSIS — Z87898 Personal history of other specified conditions: Secondary | ICD-10-CM | POA: Insufficient documentation

## 2020-08-17 DIAGNOSIS — X58XXXA Exposure to other specified factors, initial encounter: Secondary | ICD-10-CM | POA: Diagnosis not present

## 2020-08-17 DIAGNOSIS — M2041 Other hammer toe(s) (acquired), right foot: Secondary | ICD-10-CM | POA: Diagnosis not present

## 2020-08-17 DIAGNOSIS — S93491A Sprain of other ligament of right ankle, initial encounter: Secondary | ICD-10-CM | POA: Diagnosis not present

## 2020-08-17 HISTORY — PX: TENDON REPAIR: SHX5111

## 2020-08-17 HISTORY — PX: ANKLE RECONSTRUCTION: SHX1151

## 2020-08-17 HISTORY — PX: ANKLE ARTHROSCOPY: SHX545

## 2020-08-17 SURGERY — RECONSTRUCTION, ANKLE
Anesthesia: General | Site: Heel | Laterality: Right

## 2020-08-17 MED ORDER — DEXMEDETOMIDINE (PRECEDEX) IN NS 20 MCG/5ML (4 MCG/ML) IV SYRINGE
PREFILLED_SYRINGE | INTRAVENOUS | Status: DC | PRN
  Administered 2020-08-17: 8 ug via INTRAVENOUS

## 2020-08-17 MED ORDER — ROCURONIUM BROMIDE 10 MG/ML (PF) SYRINGE
PREFILLED_SYRINGE | INTRAVENOUS | Status: AC
  Filled 2020-08-17: qty 10

## 2020-08-17 MED ORDER — LIDOCAINE HCL (PF) 2 % IJ SOLN
INTRAMUSCULAR | Status: AC
  Filled 2020-08-17: qty 5

## 2020-08-17 MED ORDER — METOCLOPRAMIDE HCL 10 MG PO TABS: 5.0000 mg | ORAL_TABLET | Freq: Three times a day (TID) | ORAL | Status: DC | PRN

## 2020-08-17 MED ORDER — PHENYLEPHRINE HCL (PRESSORS) 10 MG/ML IV SOLN
INTRAVENOUS | Status: DC | PRN
  Administered 2020-08-17: 50 ug via INTRAVENOUS
  Administered 2020-08-17 (×2): 100 ug via INTRAVENOUS
  Administered 2020-08-17: 50 ug via INTRAVENOUS
  Administered 2020-08-17: 100 ug via INTRAVENOUS

## 2020-08-17 MED ORDER — PROPOFOL 1000 MG/100ML IV EMUL
INTRAVENOUS | Status: AC
  Filled 2020-08-17: qty 100

## 2020-08-17 MED ORDER — FAMOTIDINE 20 MG PO TABS: 20.0000 mg | ORAL_TABLET | Freq: Once | ORAL | Status: AC

## 2020-08-17 MED ORDER — SUCCINYLCHOLINE CHLORIDE 200 MG/10ML IV SOSY
PREFILLED_SYRINGE | INTRAVENOUS | Status: DC | PRN
  Administered 2020-08-17: 140 mg via INTRAVENOUS

## 2020-08-17 MED ORDER — PHENYLEPHRINE HCL (PRESSORS) 10 MG/ML IV SOLN
INTRAVENOUS | Status: AC
  Filled 2020-08-17: qty 1

## 2020-08-17 MED ORDER — PROPOFOL 10 MG/ML IV BOLUS
INTRAVENOUS | Status: DC | PRN
Start: 2020-08-17 — End: 2020-08-17
  Administered 2020-08-17: 160 mg via INTRAVENOUS

## 2020-08-17 MED ORDER — ACETAMINOPHEN 10 MG/ML IV SOLN
INTRAVENOUS | Status: DC | PRN
  Administered 2020-08-17: 1000 mg via INTRAVENOUS

## 2020-08-17 MED ORDER — SUGAMMADEX SODIUM 500 MG/5ML IV SOLN
INTRAVENOUS | Status: AC
  Filled 2020-08-17: qty 5

## 2020-08-17 MED ORDER — POVIDONE-IODINE 7.5 % EX SOLN
Freq: Once | CUTANEOUS | Status: DC
  Filled 2020-08-17: qty 118

## 2020-08-17 MED ORDER — METOCLOPRAMIDE HCL 5 MG/ML IJ SOLN: 5.0000 mg | Freq: Three times a day (TID) | INTRAMUSCULAR | Status: DC | PRN

## 2020-08-17 MED ORDER — BUPIVACAINE HCL (PF) 0.25 % IJ SOLN
INTRAMUSCULAR | Status: AC
  Filled 2020-08-17: qty 30

## 2020-08-17 MED ORDER — LACTATED RINGERS IV SOLN: INTRAVENOUS | Status: DC

## 2020-08-17 MED ORDER — ACETAMINOPHEN 10 MG/ML IV SOLN
INTRAVENOUS | Status: AC
  Filled 2020-08-17: qty 100

## 2020-08-17 MED ORDER — BUPIVACAINE LIPOSOME 1.3 % IJ SUSP
INTRAMUSCULAR | Status: AC
  Filled 2020-08-17: qty 20

## 2020-08-17 MED ORDER — CHLORHEXIDINE GLUCONATE 0.12 % MT SOLN: 15.0000 mL | Freq: Once | OROMUCOSAL | Status: AC

## 2020-08-17 MED ORDER — MIDAZOLAM HCL 2 MG/2ML IJ SOLN
INTRAMUSCULAR | Status: AC
  Filled 2020-08-17: qty 2

## 2020-08-17 MED ORDER — ONDANSETRON HCL 4 MG/2ML IJ SOLN: 4.0000 mg | Freq: Once | INTRAMUSCULAR | Status: DC | PRN

## 2020-08-17 MED ORDER — DEXAMETHASONE SODIUM PHOSPHATE 10 MG/ML IJ SOLN
INTRAMUSCULAR | Status: AC
  Filled 2020-08-17: qty 1

## 2020-08-17 MED ORDER — FENTANYL CITRATE (PF) 100 MCG/2ML IJ SOLN
INTRAMUSCULAR | Status: AC
  Filled 2020-08-17: qty 2

## 2020-08-17 MED ORDER — MIDAZOLAM HCL 2 MG/2ML IJ SOLN
INTRAMUSCULAR | Status: DC | PRN
  Administered 2020-08-17: 2 mg via INTRAVENOUS

## 2020-08-17 MED ORDER — CEFAZOLIN SODIUM-DEXTROSE 2-4 GM/100ML-% IV SOLN
2.0000 g | INTRAVENOUS | Status: AC
  Administered 2020-08-17: 2 g via INTRAVENOUS

## 2020-08-17 MED ORDER — OXYCODONE-ACETAMINOPHEN 5-325 MG PO TABS
1.0000 | ORAL_TABLET | Freq: Once | ORAL | Status: AC
Start: 2020-08-17 — End: 2020-08-17
  Administered 2020-08-17: 1 via ORAL

## 2020-08-17 MED ORDER — BUPIVACAINE LIPOSOME 1.3 % IJ SUSP
INTRAMUSCULAR | Status: DC | PRN
  Administered 2020-08-17: 20 mL

## 2020-08-17 MED ORDER — LIDOCAINE HCL (CARDIAC) PF 100 MG/5ML IV SOSY
PREFILLED_SYRINGE | INTRAVENOUS | Status: DC | PRN
  Administered 2020-08-17: 60 mg via INTRAVENOUS

## 2020-08-17 MED ORDER — FENTANYL CITRATE (PF) 100 MCG/2ML IJ SOLN
25.0000 ug | INTRAMUSCULAR | Status: DC | PRN
  Administered 2020-08-17: 25 ug via INTRAVENOUS

## 2020-08-17 MED ORDER — APREPITANT 40 MG PO CAPS: 40.0000 mg | ORAL_CAPSULE | Freq: Once | ORAL | Status: AC

## 2020-08-17 MED ORDER — FENTANYL CITRATE (PF) 100 MCG/2ML IJ SOLN
INTRAMUSCULAR | Status: DC | PRN
  Administered 2020-08-17 (×2): 50 ug via INTRAVENOUS

## 2020-08-17 MED ORDER — BUPIVACAINE-EPINEPHRINE (PF) 0.5% -1:200000 IJ SOLN
INTRAMUSCULAR | Status: AC
  Filled 2020-08-17: qty 30

## 2020-08-17 MED ORDER — 0.9 % SODIUM CHLORIDE (POUR BTL) OPTIME
TOPICAL | Status: DC | PRN
  Administered 2020-08-17: 1000 mL

## 2020-08-17 MED ORDER — SUGAMMADEX SODIUM 500 MG/5ML IV SOLN
INTRAVENOUS | Status: DC | PRN
  Administered 2020-08-17: 250 mg via INTRAVENOUS

## 2020-08-17 MED ORDER — ORAL CARE MOUTH RINSE: 15.0000 mL | Freq: Once | OROMUCOSAL | Status: AC

## 2020-08-17 MED ORDER — ONDANSETRON HCL 4 MG/2ML IJ SOLN
INTRAMUSCULAR | Status: AC
  Filled 2020-08-17: qty 2

## 2020-08-17 MED ORDER — ROCURONIUM BROMIDE 100 MG/10ML IV SOLN
INTRAVENOUS | Status: DC | PRN
  Administered 2020-08-17: 20 mg via INTRAVENOUS
  Administered 2020-08-17: 45 mg via INTRAVENOUS
  Administered 2020-08-17: 5 mg via INTRAVENOUS

## 2020-08-17 MED ORDER — ONDANSETRON HCL 4 MG/2ML IJ SOLN
INTRAMUSCULAR | Status: DC | PRN
  Administered 2020-08-17: 4 mg via INTRAVENOUS

## 2020-08-17 MED ORDER — FAMOTIDINE 20 MG PO TABS
ORAL_TABLET | ORAL | Status: AC
  Administered 2020-08-17: 20 mg via ORAL
  Filled 2020-08-17: qty 1

## 2020-08-17 MED ORDER — CHLORHEXIDINE GLUCONATE 0.12 % MT SOLN
OROMUCOSAL | Status: AC
  Administered 2020-08-17: 15 mL via OROMUCOSAL
  Filled 2020-08-17: qty 15

## 2020-08-17 MED ORDER — ONDANSETRON HCL 4 MG/2ML IJ SOLN: 4.0000 mg | Freq: Four times a day (QID) | INTRAMUSCULAR | Status: DC | PRN

## 2020-08-17 MED ORDER — DEXAMETHASONE SODIUM PHOSPHATE 10 MG/ML IJ SOLN
INTRAMUSCULAR | Status: DC | PRN
  Administered 2020-08-17: 10 mg via INTRAVENOUS

## 2020-08-17 MED ORDER — OXYCODONE-ACETAMINOPHEN 5-325 MG PO TABS
ORAL_TABLET | ORAL | Status: AC
  Filled 2020-08-17: qty 1

## 2020-08-17 MED ORDER — SUCCINYLCHOLINE CHLORIDE 200 MG/10ML IV SOSY
PREFILLED_SYRINGE | INTRAVENOUS | Status: AC
  Filled 2020-08-17: qty 10

## 2020-08-17 MED ORDER — PROPOFOL 500 MG/50ML IV EMUL
INTRAVENOUS | Status: DC | PRN
  Administered 2020-08-17: 150 ug/kg/min via INTRAVENOUS

## 2020-08-17 MED ORDER — APREPITANT 40 MG PO CAPS
ORAL_CAPSULE | ORAL | Status: AC
  Administered 2020-08-17: 40 mg via ORAL
  Filled 2020-08-17: qty 1

## 2020-08-17 MED ORDER — ONDANSETRON HCL 4 MG PO TABS: 4.0000 mg | ORAL_TABLET | Freq: Four times a day (QID) | ORAL | Status: DC | PRN

## 2020-08-17 MED ORDER — CEFAZOLIN SODIUM-DEXTROSE 2-4 GM/100ML-% IV SOLN
INTRAVENOUS | Status: AC
  Filled 2020-08-17: qty 100

## 2020-08-17 MED ORDER — LIDOCAINE-EPINEPHRINE 1 %-1:100000 IJ SOLN
INTRAMUSCULAR | Status: AC
  Filled 2020-08-17: qty 1

## 2020-08-17 MED ORDER — LACTATED RINGERS IR SOLN
Status: DC | PRN
  Administered 2020-08-17 (×4): 3000 mL

## 2020-08-17 SURGICAL SUPPLY — 63 items
ADAPTER IRRIG TUBE 2 SPIKE SOL (ADAPTER) ×6 IMPLANT
ADPR TBG 2 SPK PMP STRL ASCP (ADAPTER) ×4
ARTHROWAND PARAGON T2 (SURGICAL WAND)
BLADE FULL RADIUS 2.9 (BLADE) IMPLANT
BLADE OSC/SAGITTAL MD 5.5X18 (BLADE) ×3 IMPLANT
BLADE SHAVER 2.9D 7 MINI (BLADE) ×3 IMPLANT
BNDG CMPR STD VLCR NS LF 5.8X4 (GAUZE/BANDAGES/DRESSINGS) ×4
BNDG COHESIVE 4X5 TAN ST LF (GAUZE/BANDAGES/DRESSINGS) ×3 IMPLANT
BNDG CONFORM 2 STRL LF (GAUZE/BANDAGES/DRESSINGS) ×3 IMPLANT
BNDG CONFORM 3 STRL LF (GAUZE/BANDAGES/DRESSINGS) ×3 IMPLANT
BNDG ELASTIC 4X5.8 VLCR NS LF (GAUZE/BANDAGES/DRESSINGS) ×6 IMPLANT
BNDG ESMARK 4X12 TAN STRL LF (GAUZE/BANDAGES/DRESSINGS) ×3 IMPLANT
BNDG GAUZE ELAST 4 BULKY (GAUZE/BANDAGES/DRESSINGS) ×3 IMPLANT
BOOT STEPPER DURA MED (SOFTGOODS) ×3 IMPLANT
BUR AGGRESSIVE+ 2.5 (BURR) ×3 IMPLANT
CUFF TOURN SGL QUICK 18X4 (TOURNIQUET CUFF) IMPLANT
CUFF TOURN SGL QUICK 24 (TOURNIQUET CUFF)
CUFF TRNQT CYL 24X4X16.5-23 (TOURNIQUET CUFF) IMPLANT
DRAPE FLUOR MINI C-ARM 54X84 (DRAPES) ×3 IMPLANT
DRAPE IMP U-DRAPE 54X76 (DRAPES) ×3 IMPLANT
DURAPREP 26ML APPLICATOR (WOUND CARE) ×3 IMPLANT
ETHIBOND 2 0 GREEN CT 2 30IN (SUTURE) ×3 IMPLANT
GAUZE SPONGE 4X4 12PLY STRL (GAUZE/BANDAGES/DRESSINGS) ×3 IMPLANT
GAUZE XEROFORM 1X8 LF (GAUZE/BANDAGES/DRESSINGS) ×3 IMPLANT
GLOVE SURG ENC MOIS LTX SZ7.5 (GLOVE) ×3 IMPLANT
GLOVE SURG UNDER LTX SZ8 (GLOVE) ×3 IMPLANT
GOWN STRL REUS W/ TWL XL LVL3 (GOWN DISPOSABLE) ×4 IMPLANT
GOWN STRL REUS W/TWL XL LVL3 (GOWN DISPOSABLE) ×6
IV LACTATED RINGER IRRG 3000ML (IV SOLUTION) ×12
IV LR IRRIG 3000ML ARTHROMATIC (IV SOLUTION) ×8 IMPLANT
KIT TURNOVER KIT A (KITS) ×3 IMPLANT
LABEL OR SOLS (LABEL) ×3 IMPLANT
MANIFOLD NEPTUNE II (INSTRUMENTS) ×6 IMPLANT
NDL SAFETY ECLIPSE 18X1.5 (NEEDLE) ×2 IMPLANT
NEEDLE HYPO 18GX1.5 SHARP (NEEDLE) ×3
NEEDLE HYPO 25X1 1.5 SAFETY (NEEDLE) ×3 IMPLANT
PACK ARTHROSCOPY KNEE (MISCELLANEOUS) ×3 IMPLANT
PENCIL ELECTRO HAND CTR (MISCELLANEOUS) ×3 IMPLANT
RESECTOR FULL RADIUS 2.9 (ORTHOPEDIC DISPOSABLE SUPPLIES) IMPLANT
SPONGE T-LAP 18X18 ~~LOC~~+RFID (SPONGE) ×3 IMPLANT
STOCKINETTE M/LG 89821 (MISCELLANEOUS) ×3 IMPLANT
STRAP ANKLE FOOT DISTRACTOR (ORTHOPEDIC SUPPLIES) IMPLANT
STRAP SAFETY 5IN WIDE (MISCELLANEOUS) ×3 IMPLANT
SUT ETH BLK MONO 3 0 FS 1 12/B (SUTURE) ×3 IMPLANT
SUT ETHIBOND 2-0 (SUTURE) ×6 IMPLANT
SUT ETHIBOND GREEN BRAID 0S 4 (SUTURE) ×12 IMPLANT
SUT ETHILON 4-0 (SUTURE) ×3
SUT ETHILON 4-0 FS2 18XMFL BLK (SUTURE) ×2
SUT PDS II 3-0 (SUTURE) ×6 IMPLANT
SUT VIC AB 3-0 SH 27 (SUTURE) ×6
SUT VIC AB 3-0 SH 27X BRD (SUTURE) ×4 IMPLANT
SUT VIC AB 4-0 SH 27 (SUTURE) ×3
SUT VIC AB 4-0 SH 27XANBCTRL (SUTURE) ×2 IMPLANT
SUTURE ETHLN 4-0 FS2 18XMF BLK (SUTURE) ×2 IMPLANT
SYR BULB IRRIG 60ML STRL (SYRINGE) ×3 IMPLANT
TUBING CONNECTING 10 (TUBING) ×3 IMPLANT
TUBING INFLOW SET DBFLO PUMP (TUBING) ×3 IMPLANT
TUBING OUTFLOW SET DBLFO PUMP (TUBING) ×3 IMPLANT
WAND ARTHRO PARAGON T2 (SURGICAL WAND) IMPLANT
WAND COVAC 50 IFS (MISCELLANEOUS) IMPLANT
WAND TOPAZ MICRO DEBRIDER (MISCELLANEOUS) ×3 IMPLANT
WIRE Z .045 C-WIRE SPADE TIP (WIRE) ×3 IMPLANT
c-wire 0.45" spade ×3 IMPLANT

## 2020-08-17 NOTE — Transfer of Care (Signed)
Immediate Anesthesia Transfer of Care Note  Patient: Helen Garcia  Procedure(s) Performed: RECONSTRUCTION ANKLE- Brostrum-Gould (Right: Ankle) ANKLE ARTHROSCOPY- OCD repair; debridement, extensive (Right: Ankle) TENDON REPAIR- Flexor tendon repair-second; Tenolysis, multiple (Right: Heel)  Patient Location: PACU  Anesthesia Type:General  Level of Consciousness: sedated  Airway & Oxygen Therapy: Patient Spontanous Breathing and Patient connected to face mask oxygen  Post-op Assessment: Report given to RN and Post -op Vital signs reviewed and stable  Post vital signs: Reviewed and stable  Last Vitals:  Vitals Value Taken Time  BP 123/68 08/17/20 1024  Temp    Pulse 66 08/17/20 1027  Resp 17 08/17/20 1027  SpO2 94 % 08/17/20 1027  Vitals shown include unvalidated device data.  Last Pain:  Vitals:   08/17/20 0626  TempSrc: Temporal  PainSc: 5          Complications: No notable events documented.

## 2020-08-17 NOTE — Anesthesia Procedure Notes (Signed)
Procedure Name: Intubation Date/Time: 08/17/2020 7:45 AM Performed by: Aline Brochure, CRNA Pre-anesthesia Checklist: Patient identified, Patient being monitored, Timeout performed, Emergency Drugs available and Suction available Patient Re-evaluated:Patient Re-evaluated prior to induction Oxygen Delivery Method: Circle system utilized Preoxygenation: Pre-oxygenation with 100% oxygen Induction Type: IV induction Ventilation: Mask ventilation without difficulty Laryngoscope Size: 3 and McGraph Grade View: Grade II Tube type: Oral Tube size: 7.0 mm Number of attempts: 2 Airway Equipment and Method: Bougie stylet and Video-laryngoscopy Placement Confirmation: ETT inserted through vocal cords under direct vision, positive ETCO2 and breath sounds checked- equal and bilateral Secured at: 21 cm Tube secured with: Tape Dental Injury: Teeth and Oropharynx as per pre-operative assessment  Difficulty Due To: Difficulty was anticipated, Difficult Airway- due to anterior larynx and Difficult Airway- due to reduced neck mobility Future Recommendations: Recommend- induction with short-acting agent, and alternative techniques readily available

## 2020-08-17 NOTE — Discharge Instructions (Addendum)
[facility]  POST OPERATIVE INSTRUCTIONS FOR DR. FOWLER AND [facility] P[facility]PART[facility]   Take your medication as prescribed.  Pain medication should be taken only as needed.  Keep the dressing clean, dry and intact.  Keep your foot elevated above the heart level for the first 48 hours.  We have instructed you to be non-weight bearing.  Always wear your post-op shoe when walking.  Always use your crutches if you are to be non-weight bearing.  Do not take a shower. Baths are permissible as long as the foot is kept out of the water.   Every hour you are awake:  Bend your knee 15 times.  Call [facility] ([phone]) if any of the following problems occur: You develop a temperature or fever. The bandage becomes saturated with blood. Medication does not stop your pain. Injury of the foot occurs. Any symptoms of infection including redness, odor, or red streaks running from wound.  AMBULATORY SURGERY  DISCHARGE INSTRUCTIONS   The drugs that you were given will stay in your system until tomorrow so for the next 24 hours you should not:  Drive an automobile Make any legal decisions Drink any alcoholic beverage   You may resume regular meals tomorrow.  Today it is better to start with liquids and gradually work up to solid foods.  You may eat anything you prefer, but it is better to start with liquids, then soup and crackers, and gradually work up to solid foods.   Please notify your doctor immediately if you have any unusual bleeding, trouble breathing, redness and pain at the surgery site, drainage, fever, or pain not relieved by medication.    Additional Instructions:  Please contact your physician with any problems or Same Day Surgery at [phone], Monday through Friday 6 am to 4 pm, or [place] [facility] number at [phone]. 

## 2020-08-17 NOTE — H&P (Signed)
HISTORY AND PHYSICAL INTERVAL NOTE:  08/17/2020  7:29 AM  Helen Garcia  has presented today for surgery, with the diagnosis of S93.491D - Sprain of anterior talofibular ligament of right ankle S86.311D - Peroneal tendon tear, right M89.9, M94.9 - Osteochondral lesion of talar dome.  The various methods of treatment have been discussed with the patient.  No guarantees were given.  After consideration of risks, benefits and other options for treatment, the patient has consented to surgery.  I have reviewed the patients' chart and labs.     A history and physical examination was performed in my office.  The patient was reexamined.  There have been no changes to this history and physical examination.  Samara Deist A

## 2020-08-17 NOTE — Op Note (Signed)
Operative note   Surgeon:Destini Cambre Lawyer: None    Preop diagnosis: 1.  Ankle synovitis with osteochondral defect right ankle 2.  Peroneal tendon tear right lateral ankle 3.  Right lateral ankle instability 4.  Hammertoe right fourth toe    Postop diagnosis: 1.  Ankle synovitis with osteochondral defect right medial shoulder ankle 2.  Peroneal tenosynovitis without tear 3.  Right lateral ankle instability 4.  Hammertoe contracture right fourth toe    Procedure: 1.  Extensive debridement right ankle with repair of osteochondral lesion 2.  Peroneal longus and peroneal brevis tenosynovectomy with tenolysis 3.  Brostrm Gould lateral ankle stabilization right ankle 4.  Hammertoe repair with PIPJ arthrodesis right fourth toe    EBL: Minimal    Anesthesia:local and general.  Local consisted of a total of 20 cc of Exparel long-acting anesthetic in 20 cc of 0.25% bupivacaine    Hemostasis: Mid calf tourniquet inflated to 200 mmHg for 120 minutes    Specimen: None    Complications: None    Operative indications:Helen Garcia is an 60 y.o. that presents today for surgical intervention.  The risks/benefits/alternatives/complications have been discussed and consent has been given.    Procedure:  Patient was brought into the OR and placed on the operating table in thesupine position. After anesthesia was obtained theright lower extremity was prepped and draped in usual sterile fashion.  Attention was directed to the anteromedial and anterolateral aspect of the right ankle.  Small portals were then performed.  The ankle joint was initially evaluated.  A large amount of inflammatory tissue was noted diffusely throughout the anterior medial and lateral aspect of the ankle joint.  A small joint shaver was then used to remove the extensive amount of inflammatory tissue and extensive debridement of this tissue was performed.  After removal of the inflammatory tissue evaluation of the ankle  joint with a small probe was performed.  There was noted to be approximately a 5 mm osteochondral defect along the medial shoulder of the talus extending into the medial gutter.  Of note there was also a osteochondral lesion on the medial malleolus at the level of the osteochondral lesion.  The osteochondral lesion was initially debrided with a shaver.  A small curette was used to remove all of the loose cartilage.  Next small microfracture was performed into the lesion region.  Small amounts of bleeding were noted within the area.  The wound was once again irrigated and flushed and final evaluation was performed.  The arthroscopy instruments were then removed.  Attention was then directed to the lateral aspect of the ankle where longitudinal incision was performed along the peroneal tendon from the posterior aspect of the fibula to the level of the fifth metatarsal base.  Sharp and blunt dissection carried down to the peroneal tendon sheath.  The sheath was then entered.  Exploration of the peroneal tendons was performed.  At this point inflammatory tissue and tenosynovitis was noted.  There was no obvious tear of the peroneal tendons throughout either the peroneal brevis or peroneal longus tendon.  The tendons were then infiltrated with the Topaz wand.  All synovitis was removed.  Exploration for a low lying muscle belly was performed and no muscle belly was found distal to the fibula.  The peroneal tendon sheath was then closed with a 3-0 Vicryl.  The peroneal retinaculum was closed with a 2-0 PDS with a pants over vest fashion for stabilization.  Attention was then directed  to the anterolateral aspect of the ankle where the anterior talofibular ligament was noted to be.  This was noted to be very thin.  The extensor retinaculum was retracted distally for later repair.  At this time with a pants over vest fashion a Brostrm procedure was performed with a 2-0 Ethibond.  The peroneal retinaculum was brought  more proximal and repaired with a 3-0 Vicryl.  Good stability was noted.  The subcutaneous tissue was then closed with a 3-0 Vicryl and the skin closed with a 3-0 nylon.  The anteromedial portals were closed with a 4-0 nylon.  Attention was directed to the third toe on the right foot where there was a rigid hammertoe contracture at the level of the PIP DIP region.  Incision was made overlying this apex of the deformity.  Sharp and blunt dissection carried down to the tendon.  This was reflected both proximal and distal.  Solid arthrodesis of the joint was noted with the toe in a contracted position.  A small wedge of bone with the apex plantar and wider area of the dorsal was removed from the surgical field until the toe was in a good rectus position.  Next a 0.045 K wire was driven from the osteotomy site through the tip of the toe and then retrograded back proximal into the proximal phalanx.  Good alignment and bone to bone apposition was noted on fluoroscopy.  The extensor tendon was reanastomosed with a 4-0 Vicryl and the skin closed with a 4-0 nylon.  A bulky sterile dressing was applied and the patient was placed in a neutral position in an equalizer walker boot.  Postoperative anesthetic locally was performed in the OR.   Patient tolerated the procedure and anesthesia well.  Was transported from the OR to the PACU with all vital signs stable and vascular status intact. To be discharged per routine protocol.  Will follow up in approximately 1 week in the outpatient clinic.

## 2020-08-17 NOTE — Anesthesia Preprocedure Evaluation (Signed)
Anesthesia Evaluation  Patient identified by MRN, date of birth, ID band Patient awake    Reviewed: Allergy & Precautions, H&P , NPO status , Patient's Chart, lab work & pertinent test results, reviewed documented beta blocker date and time   History of Anesthesia Complications (+) PONV and history of anesthetic complications  Airway Mallampati: II  TM Distance: >3 FB Neck ROM: full    Dental  (+) Teeth Intact   Pulmonary neg pulmonary ROS, neg shortness of breath, sleep apnea , former smoker,    Pulmonary exam normal        Cardiovascular Exercise Tolerance: Poor negative cardio ROS Normal cardiovascular exam Rhythm:regular Rate:Normal     Neuro/Psych PSYCHIATRIC DISORDERS Depression negative neurological ROS     GI/Hepatic Neg liver ROS, hiatal hernia, GERD  Controlled,  Endo/Other  Hypothyroidism   Renal/GU negative Renal ROS  negative genitourinary   Musculoskeletal   Abdominal   Peds  Hematology negative hematology ROS (+)   Anesthesia Other Findings Past Medical History: No date: Arthritis     Comment:  Psoriatic, Osteoarthritis 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 No date: Cervical spondylosis No date: Cervical spondylosis No date: Depression No date: Erythrocytosis No date: GERD (gastroesophageal reflux disease) No date: Glaucoma No date: Hepatic steatosis No date: History of hiatal hernia No date: Hypothyroidism No date: IBS (irritable bowel syndrome) No date: Morbid obesity with BMI of 40.0-44.9, adult ([place]) No date: Polycythemia     Comment:  Jak 2 negative No date: PONV (postoperative nausea and vomiting) No date: Sleep apnea     Comment:  on C-pap; sleep study 12/17/2018 No date: Uveitis No date: Vasovagal episode Past Surgical History: No date: ABDOMINAL HYSTERECTOMY No date: APPENDECTOMY No date: CHOLECYSTECTOMY 1974: EYE SURGERY; Left     Comment:  eye muscle 03/12/2017: KNEE  ARTHROSCOPY WITH MEDIAL MENISECTOMY; Left     Comment:  Procedure: KNEE ARTHROSCOPY WITH MEDIAL MENISECTOMY;                Surgeon: Hessie Knows, MD;  Location: [facility] ORS;                Service: Orthopedics;  Laterality: Left; No date: TONSILLECTOMY   Reproductive/Obstetrics negative OB ROS                             Anesthesia Physical Anesthesia Plan  ASA: 3  Anesthesia Plan: General ETT   Post-op Pain Management:  Regional for Post-op pain   Induction:   PONV Risk Score and Plan: 4 or greater  Airway Management Planned:   Additional Equipment:   Intra-op Plan:   Post-operative Plan:   Informed Consent: I have reviewed the patients History and Physical, chart, labs and discussed the procedure including the risks, benefits and alternatives for the proposed anesthesia with the patient or authorized representative who has indicated his/her understanding and acceptance.     Dental Advisory Given  Plan Discussed with: CRNA  Anesthesia Plan Comments:         Anesthesia Quick Evaluation

## 2020-08-20 ENCOUNTER — Encounter: Payer: Self-pay | Admitting: Podiatry

## 2020-08-20 NOTE — Anesthesia Postprocedure Evaluation (Signed)
Anesthesia Post Note  Patient: Helen Garcia  Procedure(s) Performed: RECONSTRUCTION ANKLE- Brostrum-Gould (Right: Ankle) ANKLE ARTHROSCOPY- OCD repair; debridement, extensive (Right: Ankle) TENDON REPAIR- Flexor tendon repair-second; Tenolysis, multiple (Right: Heel)  Patient location during evaluation: PACU Anesthesia Type: General Level of consciousness: awake and alert Pain management: pain level controlled Vital Signs Assessment: post-procedure vital signs reviewed and stable Respiratory status: spontaneous breathing, nonlabored ventilation, respiratory function stable and patient connected to nasal cannula oxygen Cardiovascular status: blood pressure returned to baseline and stable Postop Assessment: no apparent nausea or vomiting Anesthetic complications: no   No notable events documented.   Last Vitals:  Vitals:   08/17/20 1100 08/17/20 1128  BP: (!) 103/59 128/71  Pulse: 70 (!) 54  Resp: 17 17  Temp:  (!) 36.1 C  SpO2: 93% 93%    Last Pain:  Vitals:   08/20/20 0812  TempSrc:   PainSc: [place] Sally Menard

## 2020-08-21 ENCOUNTER — Other Ambulatory Visit: Payer: Medicare Other

## 2020-11-19 ENCOUNTER — Other Ambulatory Visit: Payer: Self-pay

## 2020-11-19 DIAGNOSIS — D751 Secondary polycythemia: Secondary | ICD-10-CM

## 2020-11-19 DIAGNOSIS — D1803 Hemangioma of intra-abdominal structures: Secondary | ICD-10-CM

## 2020-11-20 ENCOUNTER — Encounter: Payer: Self-pay | Admitting: Oncology

## 2020-11-20 ENCOUNTER — Inpatient Hospital Stay: Payer: Medicare Other

## 2020-11-20 ENCOUNTER — Inpatient Hospital Stay: Payer: Medicare Other | Attending: Oncology | Admitting: Oncology

## 2020-11-20 ENCOUNTER — Other Ambulatory Visit: Payer: Self-pay

## 2020-11-20 VITALS — BP 130/78 | HR 80 | Temp 97.2°F | Resp 18 | Wt 238.4 lb

## 2020-11-20 DIAGNOSIS — Z87891 Personal history of nicotine dependence: Secondary | ICD-10-CM | POA: Insufficient documentation

## 2020-11-20 DIAGNOSIS — G4730 Sleep apnea, unspecified: Secondary | ICD-10-CM | POA: Diagnosis not present

## 2020-11-20 DIAGNOSIS — D751 Secondary polycythemia: Secondary | ICD-10-CM | POA: Insufficient documentation

## 2020-11-20 DIAGNOSIS — Z90710 Acquired absence of both cervix and uterus: Secondary | ICD-10-CM | POA: Diagnosis not present

## 2020-11-20 NOTE — Progress Notes (Signed)
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  Most recent CBC from 04/09/2020 showed white count of 8, H&H of 15.9/28 and a platelet count of 272.  Looking back at her prior CBCs her hemoglobin has always been high between 15-16.  No clear increasing trend.  CMP within normal limits.  Patient has seen [place] [facility] in the past as well as Dr. Sonny Masters in [place].  Patient also reports seeing [facility] in the past.  She had a JAK2 testing done at that time which was negative.  Exon 12 mutation testing also negative per outside record review.  She is also had testing for obstructive sleep apnea back in 2014 she has a history of psoriasis and psoriatic arthritis and is on biologicals for that.  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  In the past despite having secondary polycythemia she has undergone phlebotomies to keep her hematocrit less than 43 for unclear reasons.  More recently patient was found to have a low ferritin of 16.  She has not had a phlebotomy for over 6 months now.  Patient is mainly here for a second opinion to see if it would be justified to offer phlebotomy when hematocrit is greater than 50 or continue to have phlebotomies to keep a hematocrit of less than 43.  Interval history-patient is doing well overall and denies any specific complaints at this time.  She has  history of psoriatic arthritis for which she is on Biologics.  Symptoms have been under good control for the last 4 years.  She is compliant with her CPAP  ECOG PS- 1 Pain scale- 0   Review of systems- Review of Systems  Constitutional:  Negative for chills, fever, malaise/fatigue and weight loss.  HENT:  Negative for congestion, ear discharge and nosebleeds.   Eyes:  Negative for blurred vision.  Respiratory:  Negative for cough, hemoptysis, sputum production, shortness of breath and wheezing.   Cardiovascular:  Negative for chest pain, palpitations, orthopnea and claudication.  Gastrointestinal:  Negative for abdominal pain, blood in stool, constipation, diarrhea, heartburn, melena, nausea and vomiting.  Genitourinary:  Negative for dysuria, flank pain, frequency, hematuria and urgency.  Musculoskeletal:  Negative for back pain, joint pain and myalgias.  Skin:  Negative for rash.  Neurological:  Negative for dizziness, tingling, focal weakness, seizures, weakness and headaches.  Endo/Heme/Allergies:  Does not bruise/bleed easily.  Psychiatric/Behavioral:  Negative for depression and suicidal ideas. The patient does not have insomnia.       Allergies  Allergen Reactions   Abatacept Hives and Itching   Codeine Nausea And Vomiting and Other (See Comments)    GI upset   Tomato Rash    Upset stomach     Past Medical History:  Diagnosis Date   Arthritis    Psoriatic, Osteoarthritis   Cavernous hemangioma of liver 2017   Cervical spondylosis    Cervical spondylosis    Depression    Erythrocytosis  GERD (gastroesophageal reflux disease)    Glaucoma    Hepatic steatosis    History of hiatal hernia    Hypothyroidism    IBS (irritable bowel syndrome)    Morbid obesity with BMI of 40.0-44.9, adult (HCC)    Polycythemia    Jak 2 negative   PONV (postoperative nausea and vomiting)    Sleep apnea    on C-pap; sleep study 12/17/2018   Uveitis    Vasovagal episode       Past Surgical History:  Procedure Laterality Date   ABDOMINAL HYSTERECTOMY     ANKLE ARTHROSCOPY Right 08/17/2020   Procedure: ANKLE ARTHROSCOPY- OCD repair; debridement, extensive;  Surgeon: Samara Deist, DPM;  Location: [facility];  Service: Podiatry;  Laterality: Right;   ANKLE RECONSTRUCTION Right 08/17/2020   Procedure: RECONSTRUCTION ANKLE- Brostrum-Gould;  Surgeon: Samara Deist, DPM;  Location: [facility];  Service: Podiatry;  Laterality: Right;   APPENDECTOMY     CHOLECYSTECTOMY     EYE SURGERY Left 1974   eye muscle   KNEE ARTHROSCOPY WITH MEDIAL MENISECTOMY Left 03/12/2017   Procedure: KNEE ARTHROSCOPY WITH MEDIAL MENISECTOMY;  Surgeon: Hessie Knows, MD;  Location: [facility];  Service: Orthopedics;  Laterality: Left;   TENDON REPAIR Right 08/17/2020   Procedure: TENDON REPAIR- Flexor tendon repair-second; Tenolysis, multiple;  Surgeon: Samara Deist, DPM;  Location: [facility];  Service: Podiatry;  Laterality: Right;   TONSILLECTOMY      Social History   Socioeconomic History   Marital status: Single    Spouse name: Not on file   Number of children: 0   Years of education: Not on file   Highest education level: Not on file  Occupational History   Not on file  Tobacco Use   Smoking status: Former    Types: Cigarettes   Smokeless tobacco: Never  Vaping Use   Vaping Use: Never used  Substance and Sexual Activity   Alcohol use: No   Drug use: Not Currently    Types: Marijuana    Comment: no use since 2019   Sexual activity: Not on file  Other Topics Concern   Not on file  Social History Narrative   Not on file   Social Determinants of Health   Financial Resource Strain: Not on file  Food Insecurity: Not on file  Transportation Needs: Not on file  Physical Activity: Not on file  Stress: Not on file  Social Connections: Not on file  Intimate Partner Violence: Not on file    Family History  Problem Relation Age of Onset   Multiple sclerosis Mother     Cirrhosis Father    Breast cancer Neg Hx      Current Outpatient Medications:    aspirin 81 MG chewable tablet, Chew 81 mg by mouth daily., Disp: , Rfl:    Cholecalciferol (VITAMIN D3) 2000 units capsule, Take 2,000 Units by mouth daily., Disp: , Rfl:    colestipol (COLESTID) 5 g packet, Take 5 g by mouth 2 (two) times daily., Disp: , Rfl:    COSENTYX SENSOREADY, 300 MG, 150 MG/ML SOAJ, Inject 300 mg into the skin every 30 (thirty) days., Disp: , Rfl:    Cyanocobalamin 1000 MCG CAPS, Take 1,000 mcg by mouth daily., Disp: , Rfl:    Multiple Vitamin (MULTIVITAMIN WITH MINERALS) TABS tablet, Take 1 tablet by mouth daily., Disp: , Rfl:    naproxen (NAPROSYN) 500 MG tablet, Take 500 mg by mouth 2 (two) times daily as needed for moderate pain., Disp: ,  Rfl:    oxyCODONE-acetaminophen (PERCOCET/ROXICET) 5-325 MG tablet, Take 1 tablet by mouth every 6 (six) hours as needed for severe pain., Disp: , Rfl:    SYNTHROID 112 MCG tablet, Take 112 mcg by mouth daily before breakfast., Disp: , Rfl:   Physical exam:  Vitals:   11/20/20 1020  BP: 130/78  Pulse: 80  Resp: 18  Temp: (!) 97.2 F (36.2 C)  SpO2: 96%  Weight: 238 lb 6.4 oz (108.1 kg)   Physical Exam Constitutional:      General: She is not in acute distress. Cardiovascular:     Rate and Rhythm: Normal rate and regular rhythm.     Heart sounds: Normal heart sounds.  Pulmonary:     Effort: Pulmonary effort is normal.     Breath sounds: Normal breath sounds.  Skin:    General: Skin is warm and dry.  Neurological:     Mental Status: She is alert and oriented to person, place, and time.     CMP Latest Ref Rng & Units 02/16/2018  Glucose 70 - 99 mg/dL 95  BUN 6 - 20 mg/dL 14  Creatinine 0.44 - 1.00 mg/dL 0.78  Sodium 135 - 145 mmol/L 139  Potassium 3.5 - 5.1 mmol/L 4.2  Chloride 98 - 111 mmol/L 104  CO2 22 - 32 mmol/L 28  Calcium 8.9 - 10.3 mg/dL 10.0  Total Protein 6.5 - 8.1 g/dL 7.2  Total Bilirubin 0.3 - 1.2 mg/dL 0.5   Alkaline Phos 38 - 126 U/L 114  AST 15 - 41 U/L 20  ALT 0 - 44 U/L 23   CBC Latest Ref Rng & Units 02/16/2018  WBC 4.0 - 10.5 K/uL 7.9  Hemoglobin 12.0 - 15.0 g/dL 16.5(H)  Hematocrit 36.0 - 46.0 % 49.5(H)  Platelets 150 - 400 K/uL 270    Assessment and plan- Patient is a 60 y.o. female who is here for routine follow-up of secondary polycythemia  Hemoglobin remained stable between 15-16 and her hematocrit has been consistently less than 50.  Her most recent H&H from10/06/2020 were 16/48.3.  She therefore does not require phlebotomy at this time and has not required one in the last 7 to 8 months.  Repeat CBC in 4 months and 8 months and I will see her in 8 months.  Patient does state that she gets labs through other sources as well and will let us know when she gets them in 4 months   Visit Diagnosis 1. Polycythemia, secondary      Dr. Randa Evens, MD, MPH [facility] [phone] 11/20/2020 12:55 PM

## 2021-04-29 ENCOUNTER — Other Ambulatory Visit: Payer: Self-pay | Admitting: Family Medicine

## 2021-04-29 DIAGNOSIS — Z1231 Encounter for screening mammogram for malignant neoplasm of breast: Secondary | ICD-10-CM

## 2021-05-08 DIAGNOSIS — M79645 Pain in left finger(s): Secondary | ICD-10-CM | POA: Insufficient documentation

## 2021-05-09 DIAGNOSIS — M65949 Unspecified synovitis and tenosynovitis, unspecified hand: Secondary | ICD-10-CM | POA: Insufficient documentation

## 2021-07-12 ENCOUNTER — Ambulatory Visit
Admission: RE | Admit: 2021-07-12 | Discharge: 2021-07-12 | Disposition: A | Discharge status: Home / Self Care | Payer: Medicare Other | Source: Ambulatory Visit | Attending: Family Medicine | Admitting: Family Medicine

## 2021-07-12 DIAGNOSIS — Z1231 Encounter for screening mammogram for malignant neoplasm of breast: Secondary | ICD-10-CM | POA: Diagnosis present

## 2021-07-17 ENCOUNTER — Inpatient Hospital Stay: Payer: Medicare Other | Admitting: Medical Oncology

## 2021-07-17 ENCOUNTER — Telehealth: Payer: Self-pay | Admitting: Oncology

## 2021-07-17 NOTE — Telephone Encounter (Signed)
Pt called and stated she did not want have a virtual visit. She suggested if her labs were ok, she would be ok if we moved her appt out a few months.

## 2021-07-25 ENCOUNTER — Encounter: Payer: Self-pay | Admitting: Medical Oncology

## 2021-07-25 ENCOUNTER — Inpatient Hospital Stay: Payer: Medicare Other | Attending: Oncology | Admitting: Medical Oncology

## 2021-07-25 VITALS — BP 145/90 | HR 70 | Temp 98.7°F | Resp 20 | Wt 235.8 lb

## 2021-07-25 DIAGNOSIS — D751 Secondary polycythemia: Secondary | ICD-10-CM | POA: Diagnosis present

## 2021-07-26 NOTE — Progress Notes (Signed)
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  Most recent CBC from 04/09/2020 showed white count of 8, H&H of 15.9/28 and a platelet count of 272.  Looking back at her prior CBCs her hemoglobin has always been high between 15-16.  No clear increasing trend.  CMP within normal limits.  Patient has seen [place] [facility] in the past as well as Dr. Sonny Masters in [place].  Patient also reports seeing [facility] in the past.  She had a JAK2 testing done at that time which was negative.  Exon 12 mutation testing also negative per outside record review.  She is also had testing for obstructive sleep apnea back in 2014 she has a history of psoriasis and psoriatic arthritis and is on biologicals for that.  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  In the past despite having secondary polycythemia she has undergone phlebotomies to keep her hematocrit less than 43 for unclear reasons.  More recently patient was found to have a low ferritin of 16.  She has not had a phlebotomy for over 6 months now.  Patient is mainly here for a second opinion to see if it would be justified to offer phlebotomy when hematocrit is greater than 50 or continue to have phlebotomies to keep a hematocrit of less than 43.  Interval history- Patient states that she has been doing well. Asymptomatic. She is compliant with her  CPAP. No bleeding or clotting episodes.   ECOG PS- 1 Pain scale- 0   Review of systems- Review of Systems  Constitutional:  Negative for chills, fever, malaise/fatigue and weight loss.  HENT:  Negative for congestion, ear discharge and nosebleeds.   Eyes:  Negative for blurred vision.  Respiratory:  Negative for cough, hemoptysis, sputum production, shortness of breath and wheezing.   Cardiovascular:  Negative for chest pain, palpitations, orthopnea and claudication.  Gastrointestinal:  Negative for abdominal pain, blood in stool, constipation, diarrhea, heartburn, melena, nausea and vomiting.  Genitourinary:  Negative for dysuria, flank pain, frequency, hematuria and urgency.  Musculoskeletal:  Negative for back pain, joint pain and myalgias.  Skin:  Negative for rash.  Neurological:  Negative for dizziness, tingling, focal weakness, seizures, weakness and headaches.  Endo/Heme/Allergies:  Does not bruise/bleed easily.  Psychiatric/Behavioral:  Negative for depression and suicidal ideas. The patient does not have insomnia.        Allergies  Allergen Reactions   Abatacept Hives and Itching   Codeine Nausea And Vomiting and Other (See Comments)    GI upset   Tomato Rash    Upset stomach     Past Medical History:  Diagnosis Date   Arthritis    Psoriatic, Osteoarthritis   Cavernous hemangioma of liver 2017   Cervical spondylosis    Cervical spondylosis    Depression    Erythrocytosis    GERD (gastroesophageal reflux disease)    Glaucoma    Hepatic steatosis    History of hiatal hernia  Hypothyroidism    IBS (irritable bowel syndrome)    Morbid obesity with BMI of 40.0-44.9, adult (HCC)    Polycythemia    Jak 2 negative   PONV (postoperative nausea and vomiting)    Sleep apnea    on C-pap; sleep study 12/17/2018   Uveitis    Vasovagal episode      Past Surgical History:  Procedure Laterality Date   ABDOMINAL HYSTERECTOMY     ANKLE ARTHROSCOPY Right  08/17/2020   Procedure: ANKLE ARTHROSCOPY- OCD repair; debridement, extensive;  Surgeon: Samara Deist, DPM;  Location: [facility];  Service: Podiatry;  Laterality: Right;   ANKLE RECONSTRUCTION Right 08/17/2020   Procedure: RECONSTRUCTION ANKLE- Brostrum-Gould;  Surgeon: Samara Deist, DPM;  Location: [facility];  Service: Podiatry;  Laterality: Right;   APPENDECTOMY     CHOLECYSTECTOMY     EYE SURGERY Left 1974   eye muscle   KNEE ARTHROSCOPY WITH MEDIAL MENISECTOMY Left 03/12/2017   Procedure: KNEE ARTHROSCOPY WITH MEDIAL MENISECTOMY;  Surgeon: Hessie Knows, MD;  Location: [facility];  Service: Orthopedics;  Laterality: Left;   TENDON REPAIR Right 08/17/2020   Procedure: TENDON REPAIR- Flexor tendon repair-second; Tenolysis, multiple;  Surgeon: Samara Deist, DPM;  Location: [facility];  Service: Podiatry;  Laterality: Right;   TONSILLECTOMY      Social History   Socioeconomic History   Marital status: Single    Spouse name: Not on file   Number of children: 0   Years of education: Not on file   Highest education level: Not on file  Occupational History   Not on file  Tobacco Use   Smoking status: Former    Types: Cigarettes   Smokeless tobacco: Never  Vaping Use   Vaping Use: Never used  Substance and Sexual Activity   Alcohol use: No   Drug use: Not Currently    Types: Marijuana    Comment: no use since 2019   Sexual activity: Not on file  Other Topics Concern   Not on file  Social History Narrative   Not on file   Social Determinants of Health   Financial Resource Strain: Not on file  Food Insecurity: Not on file  Transportation Needs: Not on file  Physical Activity: Not on file  Stress: Not on file  Social Connections: Not on file  Intimate Partner Violence: Not on file    Family History  Problem Relation Age of Onset   Multiple sclerosis Mother    Cirrhosis Father    Breast cancer Neg Hx      Current Outpatient Medications:    aspirin 81 MG chewable  tablet, Chew 81 mg by mouth daily., Disp: , Rfl:    Cholecalciferol (VITAMIN D3) 2000 units capsule, Take 2,000 Units by mouth daily., Disp: , Rfl:    clotrimazole-betamethasone (LOTRISONE) cream, Apply topically 2 (two) times daily., Disp: , Rfl:    colestipol (COLESTID) 5 g packet, Take 5 g by mouth 2 (two) times daily., Disp: , Rfl:    COSENTYX SENSOREADY, 300 MG, 150 MG/ML SOAJ, Inject 300 mg into the skin every 30 (thirty) days., Disp: , Rfl:    Cyanocobalamin 1000 MCG CAPS, Take 1,000 mcg by mouth daily., Disp: , Rfl:    Multiple Vitamin (MULTIVITAMIN WITH MINERALS) TABS tablet, Take 1 tablet by mouth daily., Disp: , Rfl:    naproxen (NAPROSYN) 500 MG tablet, Take 500 mg by mouth 2 (two) times daily as needed for moderate pain., Disp: , Rfl:    oxyCODONE-acetaminophen (PERCOCET/ROXICET) 5-325  MG tablet, Take 1 tablet by mouth every 6 (six) hours as needed for severe pain., Disp: , Rfl:    promethazine (PHENERGAN) 12.5 MG tablet, Take 12.5 mg by mouth every 6 (six) hours as needed., Disp: , Rfl:    SYNTHROID 112 MCG tablet, Take 112 mcg by mouth daily before breakfast., Disp: , Rfl:   Physical exam:  Vitals:   07/25/21 1033  BP: (!) 145/90  Pulse: 70  Resp: 20  Temp: 98.7 F (37.1 C)  SpO2: 97%  Weight: 235 lb 12.8 oz (107 kg)   Physical Exam Constitutional:      General: She is not in acute distress. Cardiovascular:     Rate and Rhythm: Normal rate and regular rhythm.     Heart sounds: Normal heart sounds.  Pulmonary:     Effort: Pulmonary effort is normal.     Breath sounds: Normal breath sounds.  Skin:    General: Skin is warm and dry.  Neurological:     Mental Status: She is alert and oriented to person, place, and time.         Latest Ref Rng & Units 02/16/2018    3:34 PM  CMP  Glucose 70 - 99 mg/dL 95   BUN 6 - 20 mg/dL 14   Creatinine 0.44 - 1.00 mg/dL 0.78   Sodium 135 - 145 mmol/L 139   Potassium 3.5 - 5.1 mmol/L 4.2   Chloride 98 - 111 mmol/L 104   CO2  22 - 32 mmol/L 28   Calcium 8.9 - 10.3 mg/dL 10.0   Total Protein 6.5 - 8.1 g/dL 7.2   Total Bilirubin 0.3 - 1.2 mg/dL 0.5   Alkaline Phos 38 - 126 U/L 114   AST 15 - 41 U/L 20   ALT 0 - 44 U/L 23       Latest Ref Rng & Units 02/16/2018    3:34 PM  CBC  WBC 4.0 - 10.5 K/uL 7.9   Hemoglobin 12.0 - 15.0 g/dL 16.5   Hematocrit 36.0 - 46.0 % 49.5   Platelets 150 - 400 K/uL 270     Assessment and plan- Patient is a 61 y.o. female who is here for routine follow-up of secondary polycythemia  Chronic in nature. Her hemoglobin has remained in the 15-16 range for years with hematocrit consistently less than 50 with no recent interventions. Recent hematocrit is 47.  No phlebotomy needed at this time. Repeat CBC in 4 months(Pt uses outside provider for this) and 8 months and 8 month follow up in clinic with CBC.   Visit Diagnosis 1. Polycythemia, secondary      Minna Antis [facility] [phone] 07/26/2021 5:37 PM

## 2022-03-26 ENCOUNTER — Inpatient Hospital Stay: Payer: Medicare Other

## 2022-03-26 ENCOUNTER — Other Ambulatory Visit: Payer: Medicare Other

## 2022-03-26 ENCOUNTER — Inpatient Hospital Stay: Payer: Medicare Other | Attending: Oncology | Admitting: Oncology

## 2022-03-26 ENCOUNTER — Ambulatory Visit: Payer: Medicare Other | Admitting: Oncology

## 2022-03-26 ENCOUNTER — Encounter: Payer: Self-pay | Admitting: Oncology

## 2022-03-26 VITALS — BP 144/84 | HR 82 | Temp 96.8°F | Resp 17 | Wt 238.6 lb

## 2022-03-26 DIAGNOSIS — G4730 Sleep apnea, unspecified: Secondary | ICD-10-CM | POA: Diagnosis not present

## 2022-03-26 DIAGNOSIS — Z87891 Personal history of nicotine dependence: Secondary | ICD-10-CM | POA: Insufficient documentation

## 2022-03-26 DIAGNOSIS — D751 Secondary polycythemia: Secondary | ICD-10-CM | POA: Insufficient documentation

## 2022-03-26 DIAGNOSIS — G8929 Other chronic pain: Secondary | ICD-10-CM | POA: Insufficient documentation

## 2022-03-26 DIAGNOSIS — Z90710 Acquired absence of both cervix and uterus: Secondary | ICD-10-CM | POA: Diagnosis not present

## 2022-03-26 NOTE — Progress Notes (Signed)
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  Most recent CBC from 04/09/2020 showed white count of 8, H&H of 15.9/28 and a platelet count of 272.  Looking back at her prior CBCs her hemoglobin has always been high between 15-16.  No clear increasing trend.  CMP within normal limits.  Patient has seen [facility] in the past as well as Dr. Sonny Masters in [place].  Patient also reports seeing [facility] in the past.  She had a JAK2 testing done at that time which was negative.  Exon 12 mutation testing also negative per outside record review.  She is also had testing for obstructive sleep apnea back in 2014 she has a history of psoriasis and psoriatic arthritis and is on biologicals for that.  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  In the past despite having secondary polycythemia she has undergone phlebotomies to keep her hematocrit less than 43 for unclear reasons.  More recently patient was found to have a low ferritin of 16.      Interval history-patient is doing well presently.  She does have chronic joint pain secondary to psoriatic arthritis for which she follows up with rheumatology.  ECOG PS- 1 Pain scale- 0   Review of systems- Review of Systems  Constitutional:  Positive for malaise/fatigue. Negative for chills, fever and weight loss.  HENT:  Negative  for congestion, ear discharge and nosebleeds.   Eyes:  Negative for blurred vision.  Respiratory:  Negative for cough, hemoptysis, sputum production, shortness of breath and wheezing.   Cardiovascular:  Negative for chest pain, palpitations, orthopnea and claudication.  Gastrointestinal:  Negative for abdominal pain, blood in stool, constipation, diarrhea, heartburn, melena, nausea and vomiting.  Genitourinary:  Negative for dysuria, flank pain, frequency, hematuria and urgency.  Musculoskeletal:  Positive for joint pain. Negative for back pain and myalgias.  Skin:  Negative for rash.  Neurological:  Negative for dizziness, tingling, focal weakness, seizures, weakness and headaches.  Endo/Heme/Allergies:  Does not bruise/bleed easily.  Psychiatric/Behavioral:  Negative for depression and suicidal ideas. The patient does not have insomnia.       Allergies  Allergen Reactions   Abatacept Hives and Itching   Codeine Nausea And Vomiting and Other (See Comments)    GI upset   Tomato Rash    Upset stomach     Past Medical History:  Diagnosis Date   Arthritis    Psoriatic, Osteoarthritis   Cavernous hemangioma of liver 2017   Cervical spondylosis    Cervical spondylosis    Depression    Erythrocytosis    GERD (gastroesophageal reflux disease)    Glaucoma    Hepatic steatosis    History of hiatal hernia    Hypothyroidism    IBS (irritable bowel syndrome)    Morbid obesity with BMI of 40.0-44.9, adult (HCC)    Polycythemia    Jak 2 negative   PONV (postoperative nausea and vomiting)  Sleep apnea    on C-pap; sleep study 12/17/2018   Uveitis    Vasovagal episode      Past Surgical History:  Procedure Laterality Date   ABDOMINAL HYSTERECTOMY     ANKLE ARTHROSCOPY Right 08/17/2020   Procedure: ANKLE ARTHROSCOPY- OCD repair; debridement, extensive;  Surgeon: Samara Deist, DPM;  Location: [facility];  Service: Podiatry;  Laterality: Right;   ANKLE RECONSTRUCTION Right  08/17/2020   Procedure: RECONSTRUCTION ANKLE- Brostrum-Gould;  Surgeon: Samara Deist, DPM;  Location: [facility];  Service: Podiatry;  Laterality: Right;   APPENDECTOMY     CHOLECYSTECTOMY     EYE SURGERY Left 1974   eye muscle   KNEE ARTHROSCOPY WITH MEDIAL MENISECTOMY Left 03/12/2017   Procedure: KNEE ARTHROSCOPY WITH MEDIAL MENISECTOMY;  Surgeon: Hessie Knows, MD;  Location: [facility];  Service: Orthopedics;  Laterality: Left;   TENDON REPAIR Right 08/17/2020   Procedure: TENDON REPAIR- Flexor tendon repair-second; Tenolysis, multiple;  Surgeon: Samara Deist, DPM;  Location: [facility];  Service: Podiatry;  Laterality: Right;   TONSILLECTOMY      Social History   Socioeconomic History   Marital status: Single    Spouse name: Not on file   Number of children: 0   Years of education: Not on file   Highest education level: Not on file  Occupational History   Not on file  Tobacco Use   Smoking status: Former    Types: Cigarettes   Smokeless tobacco: Never  Vaping Use   Vaping Use: Never used  Substance and Sexual Activity   Alcohol use: No   Drug use: Not Currently    Types: Marijuana    Comment: no use since 2019   Sexual activity: Not on file  Other Topics Concern   Not on file  Social History Narrative   Not on file   Social Determinants of Health   Financial Resource Strain: Not on file  Food Insecurity: Not on file  Transportation Needs: Not on file  Physical Activity: Not on file  Stress: Not on file  Social Connections: Not on file  Intimate Partner Violence: Not on file    Family History  Problem Relation Age of Onset   Multiple sclerosis Mother    Cirrhosis Father    Breast cancer Neg Hx      Current Outpatient Medications:    aspirin 81 MG chewable tablet, Chew 81 mg by mouth daily., Disp: , Rfl:    Cholecalciferol (VITAMIN D3) 2000 units capsule, Take 2,000 Units by mouth daily., Disp: , Rfl:    clotrimazole-betamethasone (LOTRISONE) cream, Apply  topically 2 (two) times daily., Disp: , Rfl:    colestipol (COLESTID) 5 g packet, Take 5 g by mouth 2 (two) times daily., Disp: , Rfl:    COSENTYX SENSOREADY, 300 MG, 150 MG/ML SOAJ, Inject 300 mg into the skin every 30 (thirty) days., Disp: , Rfl:    Cyanocobalamin 1000 MCG CAPS, Take 1,000 mcg by mouth daily., Disp: , Rfl:    Multiple Vitamin (MULTIVITAMIN WITH MINERALS) TABS tablet, Take 1 tablet by mouth daily., Disp: , Rfl:    naproxen (NAPROSYN) 500 MG tablet, Take 500 mg by mouth 2 (two) times daily as needed for moderate pain., Disp: , Rfl:    oxyCODONE-acetaminophen (PERCOCET/ROXICET) 5-325 MG tablet, Take 1 tablet by mouth every 6 (six) hours as needed for severe pain., Disp: , Rfl:    SYNTHROID 112 MCG tablet, Take 112 mcg by mouth daily before breakfast., Disp: , Rfl:  calcium carbonate 100 mg/ml SUSP, Take 25 mg/kg of elemental calcium by mouth 2 (two) times daily., Disp: , Rfl:    promethazine (PHENERGAN) 12.5 MG tablet, Take 12.5 mg by mouth every 6 (six) hours as needed. (Patient not taking: Reported on 03/26/2022), Disp: , Rfl:   Physical exam:  Vitals:   03/26/22 1129  BP: (!) 144/84  Pulse: 82  Resp: 17  Temp: (!) 96.8 F (36 C)  SpO2: 100%  Weight: 238 lb 9.6 oz (108.2 kg)   Physical Exam Cardiovascular:     Rate and Rhythm: Normal rate and regular rhythm.     Heart sounds: Normal heart sounds.  Pulmonary:     Effort: Pulmonary effort is normal.  Skin:    General: Skin is warm and dry.  Neurological:     Mental Status: She is alert and oriented to person, place, and time.         Latest Ref Rng & Units 02/16/2018    3:34 PM  CMP  Glucose 70 - 99 mg/dL 95   BUN 6 - 20 mg/dL 14   Creatinine 0.44 - 1.00 mg/dL 0.78   Sodium 135 - 145 mmol/L 139   Potassium 3.5 - 5.1 mmol/L 4.2   Chloride 98 - 111 mmol/L 104   CO2 22 - 32 mmol/L 28   Calcium 8.9 - 10.3 mg/dL 10.0   Total Protein 6.5 - 8.1 g/dL 7.2   Total Bilirubin 0.3 - 1.2 mg/dL 0.5   Alkaline Phos 38  - 126 U/L 114   AST 15 - 41 U/L 20   ALT 0 - 44 U/L 23       Latest Ref Rng & Units 02/16/2018    3:34 PM  CBC  WBC 4.0 - 10.5 K/uL 7.9   Hemoglobin 12.0 - 15.0 g/dL 16.5   Hematocrit 36.0 - 46.0 % 49.5   Platelets 150 - 400 K/uL 270     No images are attached to the encounter.  No results found.   Assessment and plan- Patient is a 62 y.o. female here for routine follow-up of secondary polycythemia likely secondary to sleep apnea  Patient's hemoglobin has remained stable between 15-16 over the last1 year.  Her recent CBC from 02/11/2022 showed a hemoglobin of 15.3 with a hematocrit of 46.3.  She does not require phlebotomy at this time and goal for phlebotomy would be if her hematocrit is consistently greater than 50-55.  I will repeat CBC with differential in 6 months in 1 year and see her back in 1 year   Visit Diagnosis 1. Polycythemia, secondary      Dr. Randa Evens, MD, MPH [facility] [id number] 03/26/2022 1:14 PM

## 2022-05-03 IMAGING — MG MM DIGITAL SCREENING BILAT W/ TOMO AND CAD
6 of 10 series · 6 of 30 positions shown · non-contrast
Comparison: Previous exam(s).

CLINICAL DATA: Screening.

EXAM:
DIGITAL SCREENING BILATERAL MAMMOGRAM WITH TOMOSYNTHESIS AND CAD
TECHNIQUE: Bilateral screening digital craniocaudal and mediolateral oblique
mammograms were obtained. Bilateral screening digital breast
tomosynthesis was performed. The images were evaluated with
computer-aided detection.

[R MLO synth-2D (1 of 2)]
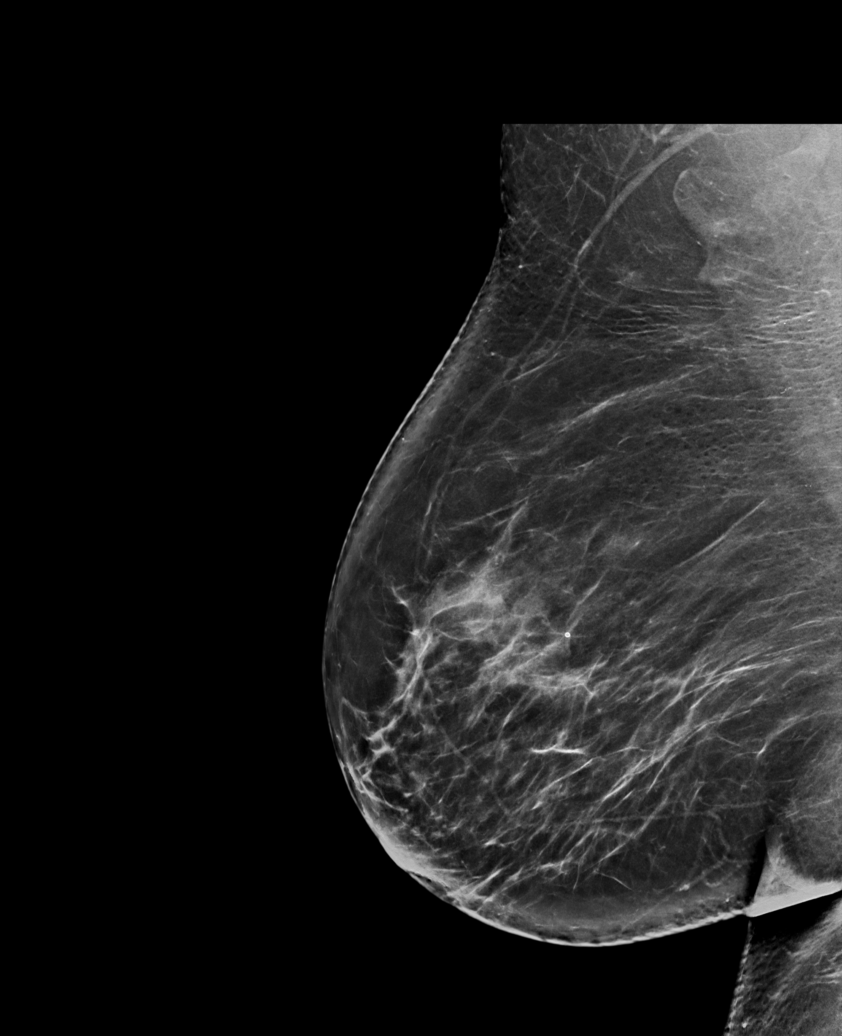

[L MLO synth-2D]
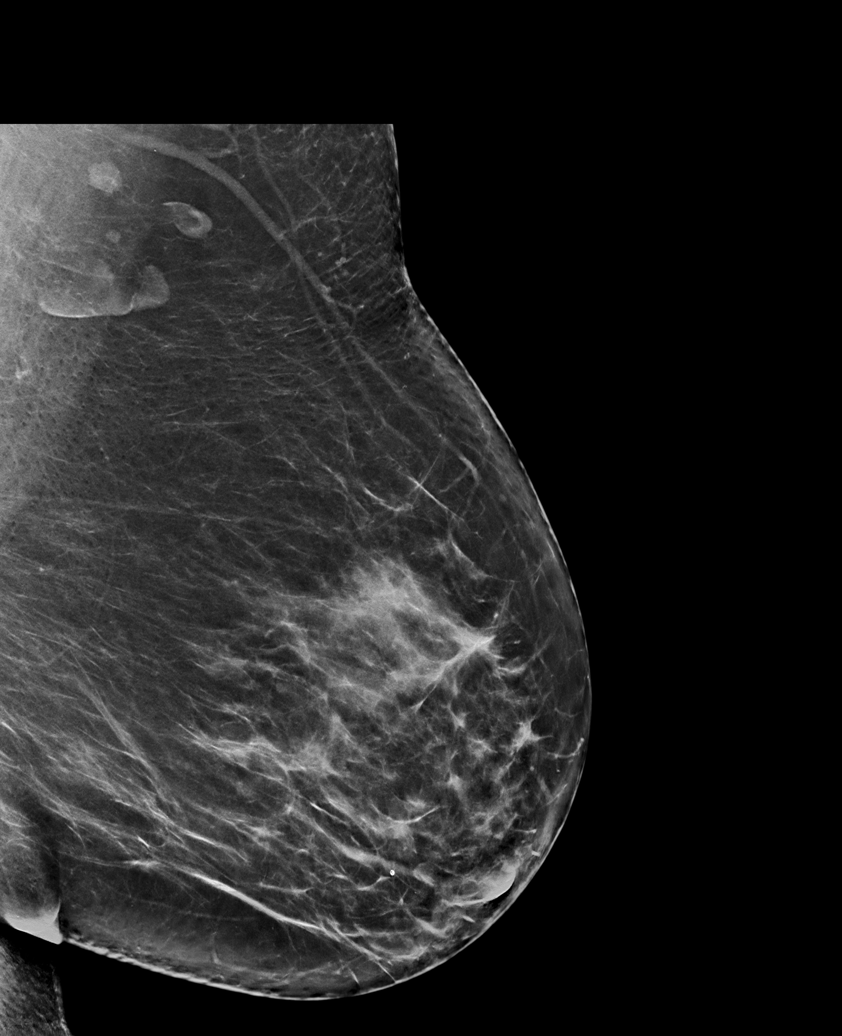

[R MLO synth-2D (2 of 2)]
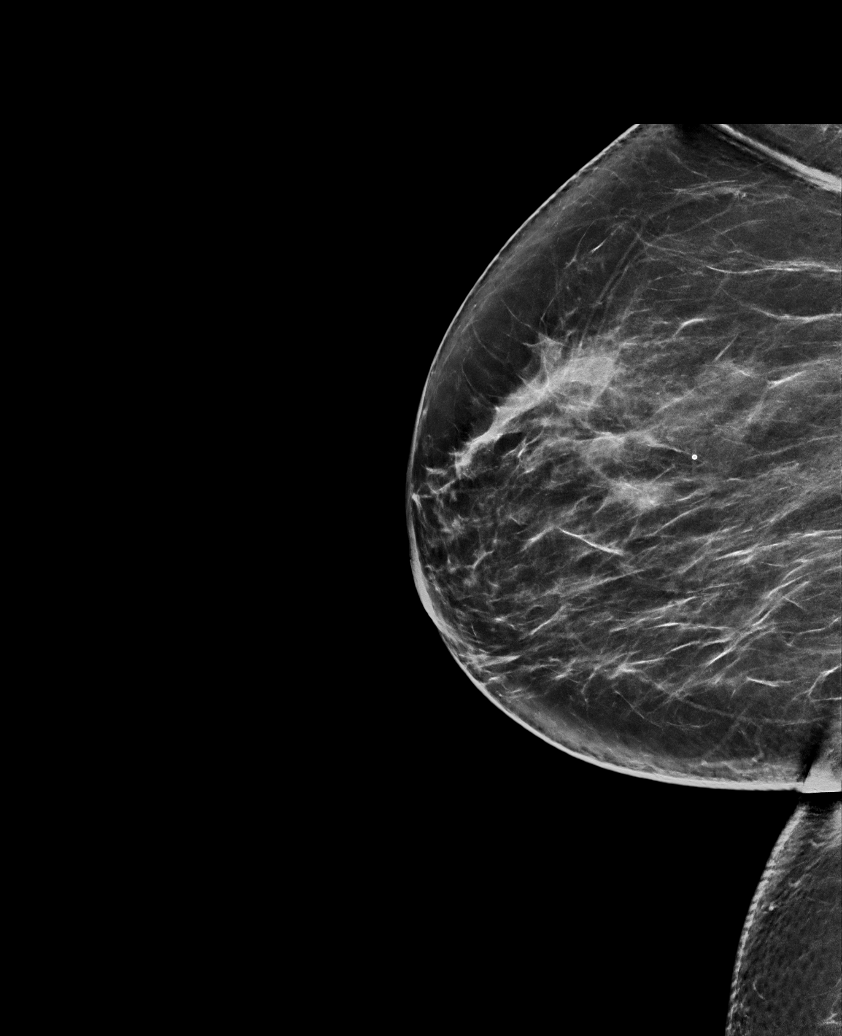

[L CC synth-2D]
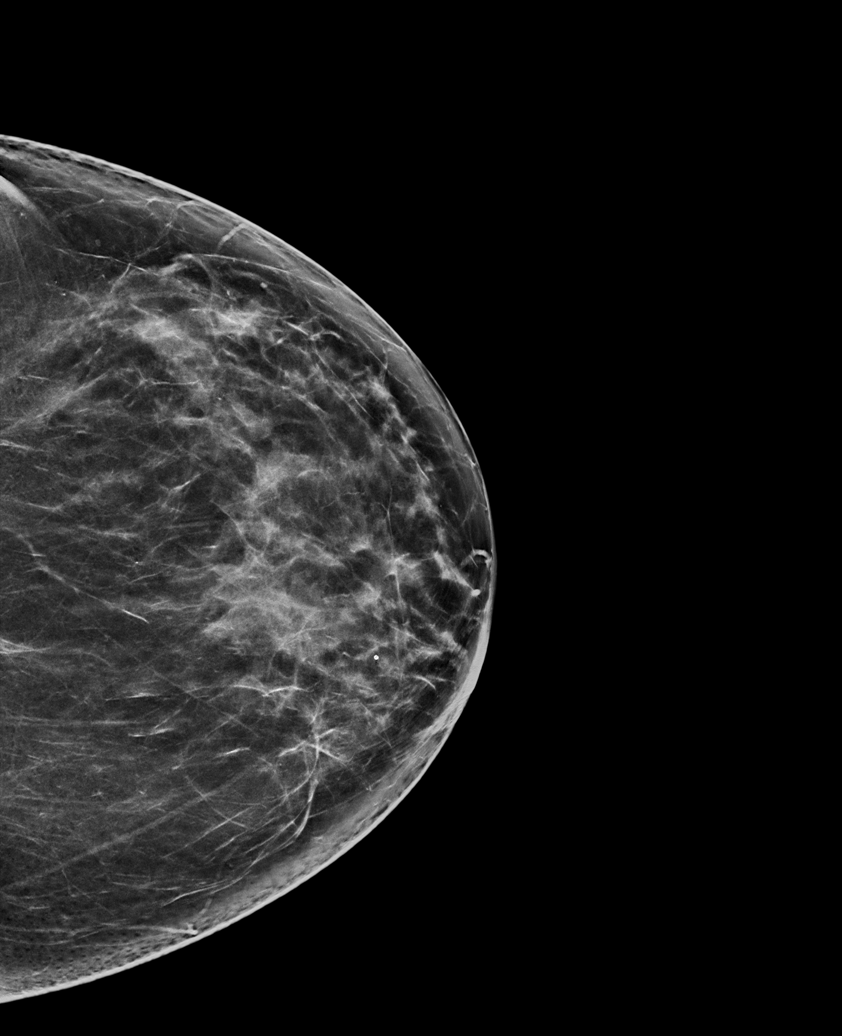

[R CC synth-2D]
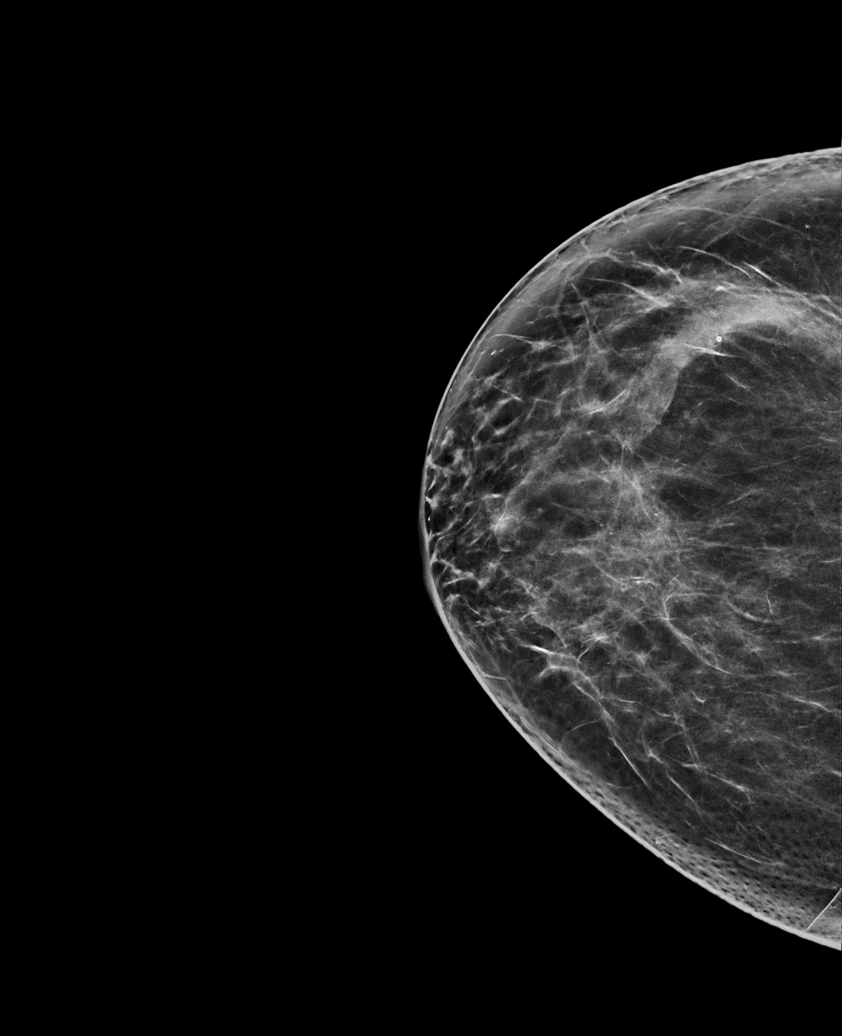

[R CC tomo · tomo slice 37/73.0]
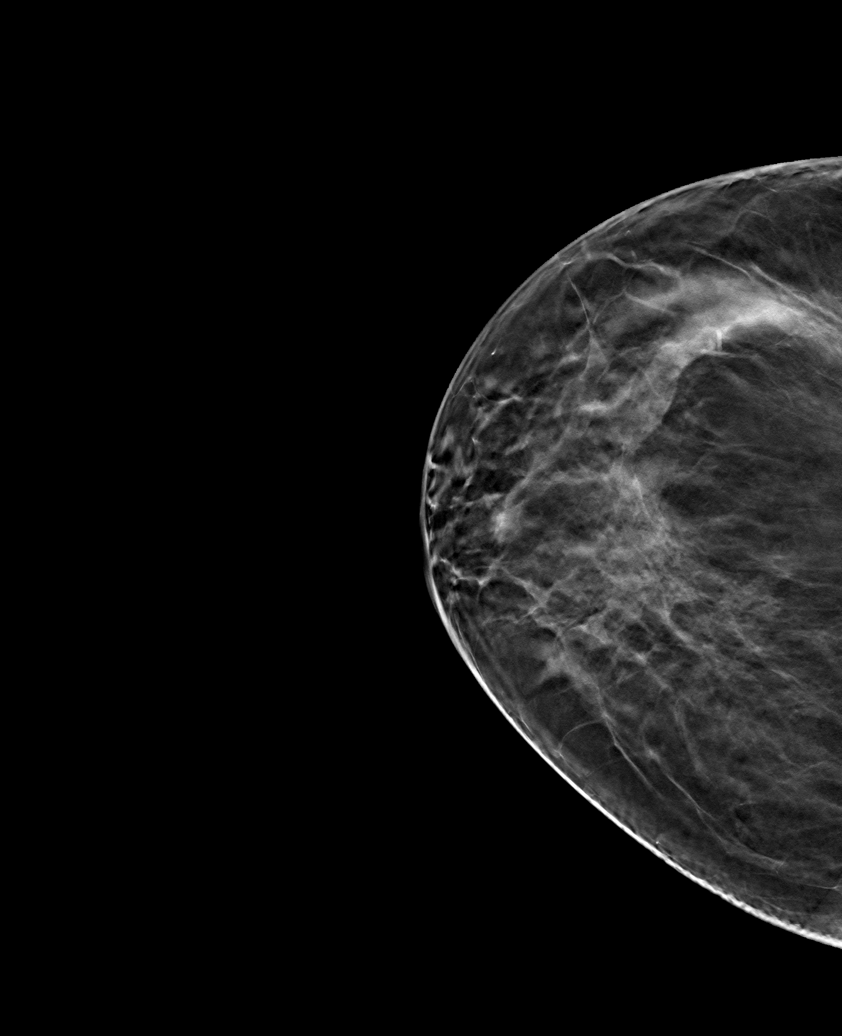

[6 of 30 positions shown; findings below may reference images not displayed]

ACR Breast Density Category c: The breast tissue is heterogeneously
dense, which may obscure small masses.
FINDINGS: There are no findings suspicious for malignancy. The images were
evaluated with computer-aided detection.
IMPRESSION: No mammographic evidence of malignancy. A result letter of this
screening mammogram will be mailed directly to the patient.

RECOMMENDATION:
Screening mammogram in one year. (Code:T4-5-GWO)

BI-RADS CATEGORY  1: Negative.

## 2022-07-07 ENCOUNTER — Other Ambulatory Visit: Payer: Self-pay | Admitting: Family Medicine

## 2022-07-07 ENCOUNTER — Other Ambulatory Visit: Payer: Self-pay | Admitting: Podiatry

## 2022-07-07 DIAGNOSIS — Z1231 Encounter for screening mammogram for malignant neoplasm of breast: Secondary | ICD-10-CM

## 2022-07-14 ENCOUNTER — Encounter: Payer: Self-pay | Admitting: Podiatry

## 2022-07-15 ENCOUNTER — Encounter: Payer: Self-pay | Admitting: Podiatry

## 2022-07-15 NOTE — Anesthesia Preprocedure Evaluation (Addendum)
Anesthesia Evaluation    History of Anesthesia Complications (+) PONV and history of anesthetic complications  Airway Mallampati: III  TM Distance: >3 FB Neck ROM: Limited   Comment: Neck pain limits neck motion, patient reports C 3, C4 as painful Dental no notable dental hx.    Pulmonary sleep apnea , former smoker   Pulmonary exam normal breath sounds clear to auscultation       Cardiovascular Normal cardiovascular exam Rhythm:Regular Rate:Normal     Neuro/Psych  PSYCHIATRIC DISORDERS  Depression       GI/Hepatic hiatal hernia,GERD  ,,  Endo/Other  Hypothyroidism    Renal/GU      Musculoskeletal  (+) Arthritis ,    Abdominal   Peds  Hematology   Anesthesia Other Findings Cavernous hemangioma of liver Depression Erythrocytosis GERD (gastroesophageal reflux disease) Glaucoma IBS (irritable bowel syndrome) Arthritis Cervica lspondylosis Polycythemia Hypothyroidism Uveitis Vasovagal episode PONV (postoperative nausea and vomiting) Cervical spondylosis Hepatic steatosis Morbid obesity with BMI of 40.0-44.9, adult Sleep apnea with CPAP History of hiatal hernia Psoriatic arthritis  Due to severe PONV, plan Zofran 4 mg IV up front preop, plan TIVA. Due to hiatal hernia, plan ETT, due to neck pain and wish to limit motion, plan McGrath for intubation to limit any neck motion and decrease risk of postop pain. If rocuronium needed, will use sugammadex for reversal to avoid PONV, otherwise, will not need reversal with succinylcholine  Reproductive/Obstetrics                              Anesthesia Physical Anesthesia Plan  ASA: 3  Anesthesia Plan: General ETT   Post-op Pain Management:    Induction: Intravenous  PONV Risk Score and Plan:   Airway Management Planned: Oral ETT  Additional Equipment:   Intra-op Plan:   Post-operative Plan: Extubation in OR  Informed Consent: I  have reviewed the patients History and Physical, chart, labs and discussed the procedure including the risks, benefits and alternatives for the proposed anesthesia with the patient or authorized representative who has indicated his/her understanding and acceptance.     Dental Advisory Given  Plan Discussed with: Anesthesiologist, CRNA and Surgeon  Anesthesia Plan Comments: (Patient consented for risks of anesthesia including but not limited to:  - adverse reactions to medications - damage to eyes, teeth, lips or other oral mucosa - nerve damage due to positioning  - sore throat or hoarseness - Damage to heart, brain, nerves, lungs, other parts of body or loss of life  Patient voiced understanding.)         Anesthesia Quick Evaluation

## 2022-07-23 ENCOUNTER — Ambulatory Visit: Payer: Medicare Other | Admitting: Anesthesiology

## 2022-07-23 ENCOUNTER — Ambulatory Visit
Admission: RE | Admit: 2022-07-23 | Discharge: 2022-07-23 | Disposition: A | Discharge status: Home / Self Care | Payer: Medicare Other | Attending: Podiatry | Admitting: Podiatry

## 2022-07-23 ENCOUNTER — Encounter
Admission: RE | Disposition: A | Discharge status: Home / Self Care | Payer: Self-pay | Source: Home / Self Care | Attending: Podiatry

## 2022-07-23 ENCOUNTER — Ambulatory Visit: Payer: Self-pay

## 2022-07-23 ENCOUNTER — Other Ambulatory Visit: Payer: Self-pay

## 2022-07-23 ENCOUNTER — Encounter: Payer: Self-pay | Admitting: Podiatry

## 2022-07-23 DIAGNOSIS — K449 Diaphragmatic hernia without obstruction or gangrene: Secondary | ICD-10-CM | POA: Diagnosis not present

## 2022-07-23 DIAGNOSIS — G4730 Sleep apnea, unspecified: Secondary | ICD-10-CM | POA: Insufficient documentation

## 2022-07-23 DIAGNOSIS — Z6841 Body Mass Index (BMI) 40.0 and over, adult: Secondary | ICD-10-CM | POA: Insufficient documentation

## 2022-07-23 DIAGNOSIS — M2042 Other hammer toe(s) (acquired), left foot: Secondary | ICD-10-CM | POA: Insufficient documentation

## 2022-07-23 DIAGNOSIS — Z09 Encounter for follow-up examination after completed treatment for conditions other than malignant neoplasm: Secondary | ICD-10-CM | POA: Insufficient documentation

## 2022-07-23 DIAGNOSIS — Z87891 Personal history of nicotine dependence: Secondary | ICD-10-CM | POA: Insufficient documentation

## 2022-07-23 HISTORY — PX: HAMMER TOE SURGERY: SHX385

## 2022-07-23 HISTORY — DX: Arthropathic psoriasis, unspecified: L40.50

## 2022-07-23 SURGERY — CORRECTION, HAMMER TOE
Anesthesia: General | Site: Toe | Laterality: Left

## 2022-07-23 MED ORDER — LACTATED RINGERS IV SOLN: INTRAVENOUS | Status: DC

## 2022-07-23 MED ORDER — SUCCINYLCHOLINE CHLORIDE 200 MG/10ML IV SOSY
PREFILLED_SYRINGE | INTRAVENOUS | Status: DC | PRN
  Administered 2022-07-23: 100 mg via INTRAVENOUS

## 2022-07-23 MED ORDER — BUPIVACAINE LIPOSOME 1.3 % IJ SUSP
INTRAMUSCULAR | Status: DC | PRN
  Administered 2022-07-23: 5 mL

## 2022-07-23 MED ORDER — ONDANSETRON HCL 4 MG/2ML IJ SOLN
4.0000 mg | Freq: Once | INTRAMUSCULAR | Status: AC
  Administered 2022-07-23: 4 mg via INTRAVENOUS

## 2022-07-23 MED ORDER — LIDOCAINE HCL (CARDIAC) PF 100 MG/5ML IV SOSY
PREFILLED_SYRINGE | INTRAVENOUS | Status: DC | PRN
  Administered 2022-07-23: 100 mg via INTRAVENOUS

## 2022-07-23 MED ORDER — PROPOFOL 10 MG/ML IV BOLUS
INTRAVENOUS | Status: DC | PRN
Start: 2022-07-23 — End: 2022-07-23
  Administered 2022-07-23: 200 mg via INTRAVENOUS

## 2022-07-23 MED ORDER — DEXAMETHASONE SODIUM PHOSPHATE 4 MG/ML IJ SOLN
INTRAMUSCULAR | Status: DC | PRN
  Administered 2022-07-23: 8 mg via INTRAVENOUS

## 2022-07-23 MED ORDER — AMISULPRIDE (ANTIEMETIC) 5 MG/2ML IV SOLN: 5.0000 mg | Freq: Once | INTRAVENOUS | Status: DC

## 2022-07-23 MED ORDER — MIDAZOLAM HCL 5 MG/5ML IJ SOLN
INTRAMUSCULAR | Status: DC | PRN
  Administered 2022-07-23: 2 mg via INTRAVENOUS

## 2022-07-23 MED ORDER — PROPOFOL 500 MG/50ML IV EMUL
INTRAVENOUS | Status: DC | PRN
  Administered 2022-07-23: 150 ug/kg/min via INTRAVENOUS

## 2022-07-23 MED ORDER — ONDANSETRON HCL 4 MG/2ML IJ SOLN
INTRAMUSCULAR | Status: DC | PRN
  Administered 2022-07-23: 4 mg via INTRAVENOUS

## 2022-07-23 MED ORDER — FENTANYL CITRATE (PF) 100 MCG/2ML IJ SOLN
INTRAMUSCULAR | Status: DC | PRN
  Administered 2022-07-23: 100 ug via INTRAVENOUS

## 2022-07-23 MED ORDER — BUPIVACAINE HCL (PF) 0.25 % IJ SOLN
INTRAMUSCULAR | Status: DC | PRN
  Administered 2022-07-23: 5 mL

## 2022-07-23 MED ORDER — CEFAZOLIN SODIUM-DEXTROSE 2-4 GM/100ML-% IV SOLN
2.0000 g | INTRAVENOUS | Status: AC
  Administered 2022-07-23: 2 g via INTRAVENOUS

## 2022-07-23 SURGICAL SUPPLY — 37 items
APL SKNCLS STERI-STRIP NONHPOA (GAUZE/BANDAGES/DRESSINGS) ×1
BENZOIN TINCTURE PRP APPL 2/3 (GAUZE/BANDAGES/DRESSINGS) ×1 IMPLANT
BLADE MINI RND TIP GREEN BEAV (BLADE) IMPLANT
BLADE OSC/SAGITTAL MD 5.5X18 (BLADE) IMPLANT
BLADE SURG 15 STRL LF DISP TIS (BLADE) IMPLANT
BLADE SURG 15 STRL SS (BLADE)
BNDG CMPR 5X4 CHSV STRCH STRL (GAUZE/BANDAGES/DRESSINGS) ×1
BNDG CMPR 75X41 PLY HI ABS (GAUZE/BANDAGES/DRESSINGS) ×1
BNDG CMPR 82X61 PLY HI ABS (GAUZE/BANDAGES/DRESSINGS) ×1
BNDG COHESIVE 4X5 TAN STRL LF (GAUZE/BANDAGES/DRESSINGS) ×1 IMPLANT
BNDG CONFORM 6X.82 1P STRL (GAUZE/BANDAGES/DRESSINGS) IMPLANT
BNDG ESMARCH 4 X 12 STRL LF (GAUZE/BANDAGES/DRESSINGS) ×1
BNDG ESMARCH 4X12 STRL LF (GAUZE/BANDAGES/DRESSINGS) ×1 IMPLANT
BNDG STRETCH 4X75 STRL LF (GAUZE/BANDAGES/DRESSINGS) ×1 IMPLANT
COVER LIGHT HANDLE UNIVERSAL (MISCELLANEOUS) ×2 IMPLANT
DRAPE FLUOR MINI C-ARM 54X84 (DRAPES) ×1 IMPLANT
DURAPREP 26ML APPLICATOR (WOUND CARE) ×1 IMPLANT
ELECT REM PT RETURN 9FT ADLT (ELECTROSURGICAL) ×1
ELECTRODE REM PT RTRN 9FT ADLT (ELECTROSURGICAL) ×1 IMPLANT
GAUZE SPONGE 4X4 12PLY STRL (GAUZE/BANDAGES/DRESSINGS) ×1 IMPLANT
GAUZE XEROFORM 1X8 LF (GAUZE/BANDAGES/DRESSINGS) ×1 IMPLANT
GLOVE SRG 8 PF TXTR STRL LF DI (GLOVE) ×1 IMPLANT
GLOVE SURG ENC MOIS LTX SZ7.5 (GLOVE) ×1 IMPLANT
GLOVE SURG UNDER POLY LF SZ8 (GLOVE) ×1
GOWN SPEC L4 XLG W/TWL (GOWN DISPOSABLE) ×1 IMPLANT
GOWN STRL REUS W/ TWL LRG LVL3 (GOWN DISPOSABLE) ×1 IMPLANT
GOWN STRL REUS W/TWL LRG LVL3 (GOWN DISPOSABLE) ×1
KIT TURNOVER KIT A (KITS) ×1 IMPLANT
NS IRRIG 500ML POUR BTL (IV SOLUTION) ×1 IMPLANT
PACK EXTREMITY ARMC (MISCELLANEOUS) ×1 IMPLANT
PIN BALLS 3/8 F/.045 WIRE (MISCELLANEOUS) ×1 IMPLANT
STOCKINETTE IMPERVIOUS LG (DRAPES) ×1 IMPLANT
SUT ETHILON 3-0 FS-10 30 BLK (SUTURE) ×1
SUT VIC AB 4-0 SH 27 (SUTURE) ×1
SUT VIC AB 4-0 SH 27XANBCTRL (SUTURE) IMPLANT
SUTURE EHLN 3-0 FS-10 30 BLK (SUTURE) IMPLANT
kwire 1.1mm x 152mm (Wire) IMPLANT

## 2022-07-23 NOTE — Op Note (Signed)
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 2.  PIPJ arthrodesis left third toe.  3.  Open flexor tenotomy left third toe 4.  Intraoperative fluoroscopy without assistance of radiologist    EBL: Minimal    Anesthesia:local and general.  Local consisting of a total of 10 cc of a one-to-one mixture of 0.25% bupivacaine and Exparel long-acting anesthetic and a one-to-one mixture    Hemostasis: Ankle tourniquet inflated to 200 mmHg for about 30 minutes    Specimen: None    Complications: None    Operative indications:Helen Garcia is an 62 y.o. that presents today for surgical intervention.  The risks/benefits/alternatives/complications have been discussed and consent has been given.    Procedure:  Patient was brought into the OR and placed on the operating table in thesupine position. After anesthesia was obtained theleft lower extremity was prepped and draped in usual sterile fashion.  Attention was directed to the left forefoot where a longitudinal incision was performed over the left second and third toes centering over the PIPJ.  Sharp and blunt dissection carried down to the extensor tendon.  Tenotomy's were then performed.  The head of the proximal phalanx and base of the middle phalanx were exposed.  These were then transected at the surgical neck on the proximal phalanx and the articular cartilage was removed off the base of the middle phalanx.  Residual contracture of the left third toe was noted.  Attention was directed to the plantar aspect at the DIPJ region where a small incision was performed.  Sharp and blunt dissection carried down to the long flexor tendon.  An open tenotomy was performed.  This was then closed with a 3-0 nylon.  Attention redirected to the second and third toes where  0.045 K wires were driven from the base of the middle phalanx through the tip of the toe and retrograded proximal to the base of the proximal phalanx to the second and third toes.  Good alignment was noted in all planes.  All wounds were then flushed with copious amounts of irrigation.  Closure was performed with a 4-0 Vicryl for the tendon and subcutaneous tissue and a 3-0 nylon for the skin.  A bulky sterile dressing was applied.    Patient tolerated the procedure and anesthesia well.  Was transported from the OR to the PACU with all vital signs stable and vascular status intact. To be discharged per routine protocol.  Will follow up in approximately 1 week in the outpatient clinic.

## 2022-07-23 NOTE — H&P (Signed)
HISTORY AND PHYSICAL INTERVAL NOTE:  07/23/2022  11:46 AM  Helen Garcia  has presented today for surgery, with the diagnosis of M79.672 - Acute foot pain, left M20.42 - Hammertoe of left foot.  The various methods of treatment have been discussed with the patient.  No guarantees were given.  After consideration of risks, benefits and other options for treatment, the patient has consented to surgery.  I have reviewed the patients' chart and labs.     A history and physical examination was performed in my office.  The patient was reexamined.  There have been no changes to this history and physical examination.  Gwyneth Revels A

## 2022-07-23 NOTE — Discharge Instructions (Signed)

## 2022-07-23 NOTE — Transfer of Care (Signed)
Immediate Anesthesia Transfer of Care Note  Patient: Helen Garcia  Procedure(s) Performed: HAMMER TOE CORRECTION - SECOND AND THIRD (Left: Toe)  Patient Location: PACU  Anesthesia Type: General ETT  Level of Consciousness: awake, alert  and patient cooperative  Airway and Oxygen Therapy: Patient Spontanous Breathing and Patient connected to supplemental oxygen  Post-op Assessment: Post-op Vital signs reviewed, Patient's Cardiovascular Status Stable, Respiratory Function Stable, Patent Airway and No signs of Nausea or vomiting  Post-op Vital Signs: Reviewed and stable  Complications: No notable events documented.

## 2022-07-23 NOTE — Anesthesia Postprocedure Evaluation (Signed)
Anesthesia Post Note  Patient: Helen Garcia  Procedure(s) Performed: HAMMER TOE CORRECTION - SECOND AND THIRD (Left: Toe)  Patient location during evaluation: PACU Anesthesia Type: General Level of consciousness: awake and alert Pain management: pain level controlled Vital Signs Assessment: post-procedure vital signs reviewed and stable Respiratory status: spontaneous breathing, nonlabored ventilation, respiratory function stable and patient connected to nasal cannula oxygen Cardiovascular status: blood pressure returned to baseline and stable Postop Assessment: no apparent nausea or vomiting Anesthetic complications: no Comments: Patient reports a "mild sore throat," but not nauseated at all. Drinking clear liquid. No neck pain.   No notable events documented.   Last Vitals:  Vitals:   07/23/22 1330 07/23/22 1345  BP: 129/80 116/69  Pulse: 70 66  Resp: 11 14  Temp:  36.4 C  SpO2: 95% 94%    Last Pain:  Vitals:   07/23/22 1345  TempSrc:   PainSc: 0-No pain                 Gao Mitnick C Jaxxon Naeem

## 2022-07-28 ENCOUNTER — Encounter: Payer: Self-pay | Admitting: Podiatry

## 2022-08-20 ENCOUNTER — Ambulatory Visit
Admission: RE | Admit: 2022-08-20 | Discharge: 2022-08-20 | Disposition: A | Discharge status: Home / Self Care | Payer: Medicare Other | Source: Ambulatory Visit | Attending: Family Medicine | Admitting: Family Medicine

## 2022-08-20 DIAGNOSIS — Z1231 Encounter for screening mammogram for malignant neoplasm of breast: Secondary | ICD-10-CM | POA: Diagnosis present

## 2022-11-22 ENCOUNTER — Emergency Department
Admission: EM | Admit: 2022-11-22 | Discharge: 2022-11-22 | Disposition: A | Discharge status: Home / Self Care | Payer: Medicare Other | Attending: Student in an Organized Health Care Education/Training Program | Admitting: Student in an Organized Health Care Education/Training Program

## 2022-11-22 ENCOUNTER — Other Ambulatory Visit: Payer: Self-pay

## 2022-11-22 ENCOUNTER — Emergency Department: Payer: Medicare Other

## 2022-11-22 DIAGNOSIS — R112 Nausea with vomiting, unspecified: Secondary | ICD-10-CM | POA: Insufficient documentation

## 2022-11-22 DIAGNOSIS — R42 Dizziness and giddiness: Secondary | ICD-10-CM | POA: Insufficient documentation

## 2022-11-22 DIAGNOSIS — R519 Headache, unspecified: Secondary | ICD-10-CM | POA: Diagnosis not present

## 2022-11-22 LAB — CBC
HCT: 48.9 % — ABNORMAL HIGH (ref 36.0–46.0)
Hemoglobin: 16.4 g/dL — ABNORMAL HIGH (ref 12.0–15.0)
MCH: 30.1 pg (ref 26.0–34.0)
MCHC: 33.5 g/dL (ref 30.0–36.0)
MCV: 89.9 fL (ref 80.0–100.0)
Platelets: 233 10*3/uL (ref 150–400)
RBC: 5.44 MIL/uL — ABNORMAL HIGH (ref 3.87–5.11)
RDW: 12.5 % (ref 11.5–15.5)
WBC: 7.4 10*3/uL (ref 4.0–10.5)
nRBC: 0 % (ref 0.0–0.2)

## 2022-11-22 LAB — BASIC METABOLIC PANEL
Anion gap: 9 (ref 5–15)
BUN: 16 mg/dL (ref 8–23)
CO2: 22 mmol/L (ref 22–32)
Calcium: 8.9 mg/dL (ref 8.9–10.3)
Chloride: 107 mmol/L (ref 98–111)
Creatinine, Ser: 0.58 mg/dL (ref 0.44–1.00)
GFR, Estimated: >60 mL/min (ref >60)
Glucose, Bld: 149 mg/dL — ABNORMAL HIGH (ref 70–99)
Potassium: 3.9 mmol/L (ref 3.5–5.1)
Sodium: 138 mmol/L (ref 135–145)

## 2022-11-22 LAB — URINALYSIS, ROUTINE W REFLEX MICROSCOPIC
Bilirubin Urine: NEGATIVE
Glucose, UA: NEGATIVE mg/dL
Hgb urine dipstick: NEGATIVE
Ketones, ur: 20 mg/dL — AB
Leukocytes,Ua: NEGATIVE
Nitrite: NEGATIVE
Protein, ur: NEGATIVE mg/dL
Specific Gravity, Urine: 1.019 (ref 1.005–1.030)
pH: 5 (ref 5.0–8.0)

## 2022-11-22 MED ORDER — MECLIZINE HCL 12.5 MG PO TABS: 12.5000 mg | ORAL_TABLET | Freq: Three times a day (TID) | ORAL | 0 refills | Status: DC | PRN

## 2022-11-22 MED ORDER — PROCHLORPERAZINE MALEATE 10 MG PO TABS: 10.0000 mg | ORAL_TABLET | Freq: Four times a day (QID) | ORAL | 0 refills | Status: DC | PRN

## 2022-11-22 MED ORDER — DIPHENHYDRAMINE HCL 50 MG/ML IJ SOLN
12.5000 mg | Freq: Once | INTRAMUSCULAR | Status: AC
  Administered 2022-11-22: 12.5 mg via INTRAVENOUS
  Filled 2022-11-22: qty 1

## 2022-11-22 MED ORDER — ACETAMINOPHEN 325 MG PO TABS
650.0000 mg | ORAL_TABLET | Freq: Once | ORAL | Status: AC
  Administered 2022-11-22: 650 mg via ORAL
  Filled 2022-11-22: qty 2

## 2022-11-22 MED ORDER — PROCHLORPERAZINE EDISYLATE 10 MG/2ML IJ SOLN
10.0000 mg | Freq: Once | INTRAMUSCULAR | Status: AC
  Administered 2022-11-22: 10 mg via INTRAVENOUS
  Filled 2022-11-22: qty 2

## 2022-11-22 NOTE — ED Triage Notes (Signed)
First nurse note: pt to ED with [facility] from home for dizziness, nausea, vomiting, h/a started at 0200. 8mg  zofran ODT PTA

## 2022-11-22 NOTE — ED Provider Notes (Signed)
[facility] Provider Note    Event Date/Time   First MD Initiated Contact with Patient 11/22/22 1501     (approximate)   History   Dizziness   HPI  Helen Garcia is a 62 y.o. female with a history of vertigo presents to the ER for evaluation of lightheadedness dizziness headache vomiting symptoms started last night.  Denies any associated numbness or tingling.  Does have a history of vertigo but does not typically have headaches with them.  No new blurred vision.  Feels like her whole body is spinning.  No new medications.     Physical Exam   Triage Vital Signs: ED Triage Vitals [11/22/22 1240]  Encounter Vitals Group     BP 131/82     Systolic BP Percentile      Diastolic BP Percentile      Pulse Rate 63     Resp 18     Temp 97.6 F (36.4 C)     Temp Source Oral     SpO2 96 %     Weight      Height      Head Circumference      Peak Flow      Pain Score 6     Pain Loc      Pain Education      Exclude from Growth Chart     Most recent vital signs: Vitals:   11/22/22 1618 11/22/22 1708  BP: (!) 145/92 132/74  Pulse: 74 71  Resp: 18 18  Temp:    SpO2: 96% 99%     Constitutional: Alert  Eyes: Conjunctivae are normal.  Head: Atraumatic. Nose: No congestion/rhinnorhea. Mouth/Throat: Mucous membranes are moist.   Neck: Painless ROM.  Cardiovascular:   Good peripheral circulation. Respiratory: Normal respiratory effort.  No retractions.  Gastrointestinal: Soft and nontender.  Musculoskeletal:  no deformity Neurologic: CN- intact.  No facial droop, Normal FNF.  Normal heel to shin.  Sensation intact bilaterally. Normal speech and language. No gross focal neurologic deficits are appreciated. No gait instability. Skin:  Skin is warm, dry and intact. No rash noted. Psychiatric: Mood and affect are normal. Speech and behavior are normal.    ED Results / Procedures / Treatments   Labs (all labs ordered are listed, but only  abnormal results are displayed) Labs Reviewed  BASIC METABOLIC PANEL - Abnormal; Notable for the following components:      Result Value   Glucose, Bld 149 (*)    All other components within normal limits  CBC - Abnormal; Notable for the following components:   RBC 5.44 (*)    Hemoglobin 16.4 (*)    HCT 48.9 (*)    All other components within normal limits  URINALYSIS, ROUTINE W REFLEX MICROSCOPIC - Abnormal; Notable for the following components:   Color, Urine YELLOW (*)    APPearance CLEAR (*)    Ketones, ur 20 (*)    All other components within normal limits  CBG MONITORING, ED     EKG  ED ECG REPORT I, Willy Eddy, the attending physician, personally viewed and interpreted this ECG.   Date: 11/22/2022  EKG Time: 12:47  Rate: 60  Rhythm: sinus  Axis: right  Intervals: normal  ST&T Change: no stemi    RADIOLOGY Please see ED Course for my review and interpretation.  I personally reviewed all radiographic images ordered to evaluate for the above acute complaints and reviewed radiology reports and findings.  These findings were  personally discussed with the patient.  Please see medical record for radiology report.    PROCEDURES:  Critical Care performed: No  Procedures   MEDICATIONS ORDERED IN ED: Medications  prochlorperazine (COMPAZINE) injection 10 mg (10 mg Intravenous Given 11/22/22 1540)  diphenhydrAMINE (BENADRYL) injection 12.5 mg (12.5 mg Intravenous Given 11/22/22 1540)  acetaminophen (TYLENOL) tablet 650 mg (650 mg Oral Given 11/22/22 1542)     IMPRESSION / MDM / ASSESSMENT AND PLAN / ED COURSE  I reviewed the triage vital signs and the nursing notes.                              Differential diagnosis includes, but is not limited to, vertigo, lab otitis, migraine, SDH, IPH, CVA, viral illness, glaucoma  Patient presenting to the ER for evaluation of symptoms as described above.  Based on symptoms, risk factors and considered above  differential, this presenting complaint could reflect a potentially life-threatening illness therefore the patient will be placed on continuous pulse oximetry and telemetry for monitoring.  Laboratory evaluation will be sent to evaluate for the above complaints.      Clinical Course as of 11/22/22 1914  Sat Nov 22, 2022  1535 CT head on my review and interpretation without evidence of SDH or IPH. [PR]  1824 CT imaging is without focal abnormality but patient still persists with dizziness.  Given age and risk factors will order MRI to evaluate for posterior CVA.  Headache has resolved. [PR]  1907 MRI without evidence of CVA.  Reassessed with significant improvement in symptoms.  I discussed option for observation the hospital for further symptomatic management but the patient would prefer to be discharged.  Given reassuring workup I do believe she stable and appropriate for outpatient follow-up. [PR]    Clinical Course User Index [PR] Willy Eddy, MD     FINAL CLINICAL IMPRESSION(S) / ED DIAGNOSES   Final diagnoses:  Dizziness  Acute nonintractable headache, unspecified headache type     Rx / DC Orders   ED Discharge Orders          Ordered    prochlorperazine (COMPAZINE) 10 MG tablet  Every 6 hours PRN        11/22/22 1914    meclizine (ANTIVERT) 12.5 MG tablet  3 times daily PRN        11/22/22 1914             Note:  This document was prepared using Dragon voice recognition software and may include unintentional dictation errors.    Willy Eddy, MD 11/22/22 [phone]

## 2022-11-22 NOTE — Discharge Instructions (Signed)

## 2022-11-22 NOTE — ED Triage Notes (Signed)
Pt to ED via [facility] from home. Pt reports woke up around 6am this morning with mild dizziness. Pt states dizziness has gotten worse. Pt also reports N/V and HA. Pt denies SOB or CP. Pt given 8mg  zofran PTA

## 2023-02-23 DIAGNOSIS — Z6841 Body Mass Index (BMI) 40.0 and over, adult: Secondary | ICD-10-CM | POA: Insufficient documentation

## 2023-02-23 DIAGNOSIS — E66813 Obesity, class 3: Secondary | ICD-10-CM | POA: Insufficient documentation

## 2023-03-25 ENCOUNTER — Inpatient Hospital Stay: Payer: Medicare Other | Attending: Oncology | Admitting: Oncology

## 2023-03-25 ENCOUNTER — Encounter: Payer: Self-pay | Admitting: Oncology

## 2023-03-25 VITALS — BP 127/74 | HR 79 | Temp 97.7°F | Resp 19 | Ht 65.0 in | Wt 244.5 lb

## 2023-03-25 DIAGNOSIS — D751 Secondary polycythemia: Secondary | ICD-10-CM | POA: Diagnosis present

## 2023-03-25 DIAGNOSIS — Z87891 Personal history of nicotine dependence: Secondary | ICD-10-CM | POA: Diagnosis not present

## 2023-03-25 DIAGNOSIS — G4730 Sleep apnea, unspecified: Secondary | ICD-10-CM | POA: Insufficient documentation

## 2023-03-25 NOTE — Progress Notes (Signed)
 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  Most recent CBC from 04/09/2020 showed white count of 8, H&H of 15.9/28 and a platelet count of 272.  Looking back at her prior CBCs her hemoglobin has always been high between 15-16.  No clear increasing trend.  CMP within normal limits.  Patient has seen [facility] in the past as well as Dr. Henrene Hawking in [place].  Patient also reports seeing [facility] in the past.  She had a JAK2 testing done at that time which was negative.  Exon 12 mutation testing also negative per outside record review.  She is also had testing for obstructive sleep apnea back in 2014 she has a history of psoriasis and psoriatic arthritis and is on biologicals for that.  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 Currently she is on CPAP for sleep apnea.  In the past despite having secondary polycythemia she has undergone phlebotomies to keep her hematocrit less than 43 for unclear reasons.    Interval history-she is doing well presently and reports no significant complaints.  ECOG PS- 0 Pain scale- 0   Review of systems- Review of Systems  Constitutional:  Negative for chills, fever, malaise/fatigue and weight loss.  HENT:  Negative for congestion, ear discharge and nosebleeds.   Eyes:  Negative for blurred vision.  Respiratory:   Negative for cough, hemoptysis, sputum production, shortness of breath and wheezing.   Cardiovascular:  Negative for chest pain, palpitations, orthopnea and claudication.  Gastrointestinal:  Negative for abdominal pain, blood in stool, constipation, diarrhea, heartburn, melena, nausea and vomiting.  Genitourinary:  Negative for dysuria, flank pain, frequency, hematuria and urgency.  Musculoskeletal:  Negative for back pain, joint pain and myalgias.  Skin:  Negative for rash.  Neurological:  Negative for dizziness, tingling, focal weakness, seizures, weakness and headaches.  Endo/Heme/Allergies:  Does not bruise/bleed easily.  Psychiatric/Behavioral:  Negative for depression and suicidal ideas. The patient does not have insomnia.       Allergies  Allergen Reactions   Abatacept Hives and Itching   Codeine Nausea And Vomiting and Other (See Comments)    GI upset   Tomato Rash    Upset stomach     Past Medical History:  Diagnosis Date   Arthritis    Psoriatic, Osteoarthritis   Cavernous hemangioma of liver 2017   Cervical spondylosis    Cervical spondylosis    Depression    Erythrocytosis    GERD (gastroesophageal reflux disease)    Glaucoma    Hepatic steatosis    History of hiatal hernia    Hypothyroidism    IBS (irritable bowel syndrome)    Morbid obesity with BMI of 40.0-44.9, adult (HCC)    Polycythemia    Jak 2 negative   PONV (postoperative nausea and vomiting)    Psoriatic arthritis (HCC)    Sleep apnea    on C-pap; sleep study 12/17/2018   Uveitis  Vasovagal episode      Past Surgical History:  Procedure Laterality Date   ABDOMINAL HYSTERECTOMY     ANKLE ARTHROSCOPY Right 08/17/2020   Procedure: ANKLE ARTHROSCOPY- OCD repair; debridement, extensive;  Surgeon: Gwyneth Revels, DPM;  Location: [facility];  Service: Podiatry;  Laterality: Right;   ANKLE RECONSTRUCTION Right 08/17/2020   Procedure: RECONSTRUCTION ANKLE- Brostrum-Gould;  Surgeon: Gwyneth Revels,  DPM;  Location: [facility];  Service: Podiatry;  Laterality: Right;   APPENDECTOMY     CHOLECYSTECTOMY     EYE SURGERY Left 1974   eye muscle   HAMMER TOE SURGERY Left 07/23/2022   Procedure: HAMMER TOE CORRECTION - SECOND AND THIRD;  Surgeon: Gwyneth Revels, DPM;  Location: [facility];  Service: Podiatry;  Laterality: Left;   KNEE ARTHROSCOPY WITH MEDIAL MENISECTOMY Left 03/12/2017   Procedure: KNEE ARTHROSCOPY WITH MEDIAL MENISECTOMY;  Surgeon: Kennedy Bucker, MD;  Location: [facility];  Service: Orthopedics;  Laterality: Left;   TENDON REPAIR Right 08/17/2020   Procedure: TENDON REPAIR- Flexor tendon repair-second; Tenolysis, multiple;  Surgeon: Gwyneth Revels, DPM;  Location: [facility];  Service: Podiatry;  Laterality: Right;   TONSILLECTOMY      Social History   Socioeconomic History   Marital status: Single    Spouse name: Not on file   Number of children: 0   Years of education: Not on file   Highest education level: Not on file  Occupational History   Not on file  Tobacco Use   Smoking status: Former    Types: Cigarettes   Smokeless tobacco: Never  Vaping Use   Vaping status: Never Used  Substance and Sexual Activity   Alcohol use: No   Drug use: Not Currently    Types: Marijuana    Comment: no use since 2019   Sexual activity: Not on file  Other Topics Concern   Not on file  Social History Narrative   Not on file   Social Drivers of Health   Financial Resource Strain: Medium Risk (02/03/2023)   Received from [facility]   Overall Financial Resource Strain (CARDIA)    Difficulty of Paying Living Expenses: Somewhat hard  Food Insecurity: No Food Insecurity (02/03/2023)   Received from [facility]   Hunger Vital Sign    Worried About Running Out of Food in the Last Year: Never true    Ran Out of Food in the Last Year: Never true  Transportation Needs: No Transportation Needs (02/03/2023)   Received from [facility] - Transportation    In the past 12 months, has lack of transportation kept you from medical appointments or from getting medications?: No    Lack of Transportation (Non-Medical): No  Physical Activity: Not on file  Stress: Not on file  Social Connections: Not on file  Intimate Partner Violence: Not on file    Family History  Problem Relation Age of Onset   Multiple sclerosis Mother    Cirrhosis Father    Breast cancer Neg Hx      Current Outpatient Medications:    FARXIGA 10 MG TABS tablet, Take 1 tablet by mouth daily., Disp: , Rfl:    spironolactone (ALDACTONE) 25 MG tablet, Take 1 tablet by mouth daily., Disp: , Rfl:    aspirin 81 MG chewable tablet, Chew 81 mg by mouth daily., Disp: , Rfl:    calcium carbonate 100 mg/ml SUSP, Take 25 mg/kg of elemental calcium by mouth 2 (two) times  daily., Disp: , Rfl:    Cholecalciferol (VITAMIN D3) 2000 units capsule, Take 2,000 Units by mouth daily., Disp: , Rfl:    clotrimazole-betamethasone (LOTRISONE) cream, Apply topically 2 (two) times daily., Disp: , Rfl:    colestipol (COLESTID) 5 g packet, Take 5 g by mouth 2 (two) times daily., Disp: , Rfl:    COSENTYX SENSOREADY, 300 MG, 150 MG/ML SOAJ, Inject 300 mg into the skin every 30 (thirty) days., Disp: , Rfl:    Cyanocobalamin 1000 MCG CAPS, Take 1,000 mcg by mouth daily., Disp: , Rfl:    meclizine (ANTIVERT) 12.5 MG tablet, Take 1 tablet (12.5 mg total) by mouth 3 (three) times daily as needed for dizziness., Disp: 30 tablet, Rfl: 0   Multiple Vitamin (MULTIVITAMIN WITH MINERALS) TABS tablet, Take 1 tablet by mouth daily., Disp: , Rfl:    naproxen (NAPROSYN) 500 MG tablet, Take 500 mg by mouth 2 (two) times daily as needed for moderate pain., Disp: , Rfl:    oxyCODONE-acetaminophen (PERCOCET/ROXICET) 5-325 MG tablet, Take 1 tablet by mouth every 6 (six) hours as needed for severe pain., Disp: , Rfl:    prochlorperazine (COMPAZINE) 10 MG tablet, Take 1 tablet (10 mg  total) by mouth every 6 (six) hours as needed for nausea or vomiting., Disp: 12 tablet, Rfl: 0   promethazine (PHENERGAN) 12.5 MG tablet, Take 12.5 mg by mouth every 6 (six) hours as needed., Disp: , Rfl:    SYNTHROID 112 MCG tablet, Take 112 mcg by mouth daily before breakfast., Disp: , Rfl:   Physical exam:  Vitals:   03/25/23 1003  BP: 127/74  Pulse: 79  Resp: 19  Temp: 97.7 F (36.5 C)  TempSrc: Tympanic  SpO2: 96%  Weight: 244 lb 8 oz (110.9 kg)  Height: 5\' 5"  (1.651 m)   Physical Exam Cardiovascular:     Rate and Rhythm: Normal rate and regular rhythm.     Heart sounds: Normal heart sounds.  Pulmonary:     Effort: Pulmonary effort is normal.  Skin:    General: Skin is warm and dry.  Neurological:     Mental Status: She is alert and oriented to person, place, and time.         Latest Ref Rng & Units 11/22/2022   12:42 PM  CMP  Glucose 70 - 99 mg/dL 130   BUN 8 - 23 mg/dL 16   Creatinine 8.65 - 1.00 mg/dL 7.84   Sodium 696 - 295 mmol/L 138   Potassium 3.5 - 5.1 mmol/L 3.9   Chloride 98 - 111 mmol/L 107   CO2 22 - 32 mmol/L 22   Calcium 8.9 - 10.3 mg/dL 8.9       Latest Ref Rng & Units 11/22/2022   12:42 PM  CBC  WBC 4.0 - 10.5 K/uL 7.4   Hemoglobin 12.0 - 15.0 g/dL 28.4   Hematocrit 13.2 - 46.0 % 48.9   Platelets 150 - 400 K/uL 233     No images are attached to the encounter.  No results found.   Assessment and plan- Patient is a 63 y.o. female here for routine follow-up of secondary polycythemia  Secondary polycythemia possibly due to obstructive sleep apnea.  Workup of primary polycythemia vera has been negative and therefore there is no reason to keep her hematocrit less than 42.  We could consider phlebotomy for hematocrit greater than 50-55.  Presently she does not require phlebotomy.  She will be getting labs checked frequently with her rheumatology office.  I will see her back in 1 year   Visit Diagnosis 1. Secondary polycythemia      Dr.  Owens Shark, MD, MPH [facility] at [facility] [phone] 03/25/2023 1:15 PM

## 2023-03-30 ENCOUNTER — Encounter
Admission: RE | Admit: 2023-03-30 | Discharge: 2023-03-30 | Disposition: A | Discharge status: Home / Self Care | Source: Ambulatory Visit | Attending: Orthopedic Surgery | Admitting: Orthopedic Surgery

## 2023-03-30 ENCOUNTER — Other Ambulatory Visit: Payer: Self-pay

## 2023-03-30 DIAGNOSIS — L4050 Arthropathic psoriasis, unspecified: Secondary | ICD-10-CM | POA: Diagnosis not present

## 2023-03-30 DIAGNOSIS — R5382 Chronic fatigue, unspecified: Secondary | ICD-10-CM | POA: Insufficient documentation

## 2023-03-30 DIAGNOSIS — Z01818 Encounter for other preprocedural examination: Secondary | ICD-10-CM | POA: Diagnosis present

## 2023-03-30 DIAGNOSIS — M1712 Unilateral primary osteoarthritis, left knee: Secondary | ICD-10-CM | POA: Insufficient documentation

## 2023-03-30 DIAGNOSIS — D751 Secondary polycythemia: Secondary | ICD-10-CM | POA: Diagnosis not present

## 2023-03-30 DIAGNOSIS — Z01812 Encounter for preprocedural laboratory examination: Secondary | ICD-10-CM

## 2023-03-30 HISTORY — DX: Chronic diastolic (congestive) heart failure: I50.32

## 2023-03-30 HISTORY — DX: Long term (current) use of immunosuppressive biologic: Z79.620

## 2023-03-30 HISTORY — DX: Secondary polycythemia: D75.1

## 2023-03-30 HISTORY — DX: Age-related osteoporosis without current pathological fracture: M81.0

## 2023-03-30 HISTORY — DX: Obstructive sleep apnea (adult) (pediatric): G47.33

## 2023-03-30 HISTORY — DX: Unilateral primary osteoarthritis, left knee: M17.12

## 2023-03-30 HISTORY — DX: Unspecified osteoarthritis, unspecified site: M19.90

## 2023-03-30 HISTORY — DX: Polycythemia vera: D45

## 2023-03-30 LAB — URINALYSIS, ROUTINE W REFLEX MICROSCOPIC
Bacteria, UA: NONE SEEN
Bilirubin Urine: NEGATIVE
Glucose, UA: >=500 mg/dL — AB
Ketones, ur: NEGATIVE mg/dL
Leukocytes,Ua: NEGATIVE
Nitrite: NEGATIVE
Protein, ur: NEGATIVE mg/dL
Specific Gravity, Urine: 1.021 (ref 1.005–1.030)
pH: 5 (ref 5.0–8.0)

## 2023-03-30 LAB — COMPREHENSIVE METABOLIC PANEL
ALT: 28 U/L (ref 0–44)
AST: 25 U/L (ref 15–41)
Albumin: 4 g/dL (ref 3.5–5.0)
Alkaline Phosphatase: 99 U/L (ref 38–126)
Anion gap: 6 (ref 5–15)
BUN: 17 mg/dL (ref 8–23)
CO2: 30 mmol/L (ref 22–32)
Calcium: 10.1 mg/dL (ref 8.9–10.3)
Chloride: 104 mmol/L (ref 98–111)
Creatinine, Ser: 0.8 mg/dL (ref 0.44–1.00)
GFR, Estimated: >60 mL/min (ref >60)
Glucose, Bld: 107 mg/dL — ABNORMAL HIGH (ref 70–99)
Potassium: 4.3 mmol/L (ref 3.5–5.1)
Sodium: 140 mmol/L (ref 135–145)
Total Bilirubin: 0.9 mg/dL (ref 0.0–1.2)
Total Protein: 6.8 g/dL (ref 6.5–8.1)

## 2023-03-30 LAB — SURGICAL PCR SCREEN
MRSA, PCR: NEGATIVE
Staphylococcus aureus: NEGATIVE

## 2023-03-30 LAB — CBC
HCT: 49.6 % — ABNORMAL HIGH (ref 36.0–46.0)
Hemoglobin: 16.7 g/dL — ABNORMAL HIGH (ref 12.0–15.0)
MCH: 30 pg (ref 26.0–34.0)
MCHC: 33.7 g/dL (ref 30.0–36.0)
MCV: 89 fL (ref 80.0–100.0)
Platelets: 242 10*3/uL (ref 150–400)
RBC: 5.57 MIL/uL — ABNORMAL HIGH (ref 3.87–5.11)
RDW: 12.7 % (ref 11.5–15.5)
WBC: 6.8 10*3/uL (ref 4.0–10.5)
nRBC: 0 % (ref 0.0–0.2)

## 2023-03-30 LAB — SEDIMENTATION RATE: Sed Rate: 2 mm/h (ref 0–30)

## 2023-03-30 LAB — C-REACTIVE PROTEIN: CRP: 0.7 mg/dL (ref <1.0)

## 2023-03-30 NOTE — Patient Instructions (Addendum)
 Your procedure is scheduled on: Wednesday, April 2 Report to the Registration Desk on the 1st floor of the [facility] To find out your arrival time, please call [phone] between 1PM - 3PM on: Tuesday, April 1 If your arrival time is 6:00 am, do not arrive before that time as the [facility] entrance doors do not open until 6:00 am.  REMEMBER: Instructions that are not followed completely may result in serious medical risk, up to and including death; or upon the discretion of your surgeon and anesthesiologist your surgery may need to be rescheduled.  Do not eat food after midnight the night before surgery.  No gum chewing or hard candies.  You may however, drink CLEAR liquids up to 2 hours before you are scheduled to arrive for your surgery. Do not drink anything within 2 hours of your scheduled arrival time.  Clear liquids include: - water  - apple juice without pulp - gatorade (not RED colors) - black coffee or tea (Do NOT add milk or creamers to the coffee or tea) Do NOT drink anything that is not on this list.  In addition, your doctor has ordered for you to drink the provided:  Ensure Pre-Surgery Clear Carbohydrate Drink  Drinking this carbohydrate drink up to two hours before surgery helps to reduce insulin resistance and improve patient outcomes. Please complete drinking 2 hours before scheduled arrival time.  One week prior to surgery: starting March 26 Stop Anti-inflammatories (NSAIDS) such as Advil, Aleve, Ibuprofen, Motrin, Naproxen, Naprosyn and Aspirin based products such as Excedrin, Goody's Powder, BC Powder. Stop ANY OVER THE COUNTER supplements until after surgery. Stop multiple vitamin  You may however, continue to take Tylenol if needed for pain up until the day of surgery.  FARXIGA - hold for 3 days before surgery. Last day to take is Saturday, March 29. Resume AFTER surgery.  Continue taking all of your other prescription medications up until the day of  surgery.  ON THE DAY OF SURGERY ONLY TAKE THESE MEDICATIONS WITH SIPS OF WATER:  colestipol (COLESTID)  SYNTHROID   No Alcohol for 24 hours before or after surgery.  No Smoking including e-cigarettes for 24 hours before surgery.  No chewable tobacco products for at least 6 hours before surgery.  No nicotine patches on the day of surgery.  Do not use any "recreational" drugs for at least a week (preferably 2 weeks) before your surgery.  Please be advised that the combination of cocaine and anesthesia may have negative outcomes, up to and including death. If you test positive for cocaine, your surgery will be cancelled.  On the morning of surgery brush your teeth with toothpaste and water, you may rinse your mouth with mouthwash if you wish. Do not swallow any toothpaste or mouthwash.  Use CHG Soap as directed on instruction sheet.  Do not wear jewelry, make-up, hairpins, clips or nail polish.  For welded (permanent) jewelry: bracelets, anklets, waist bands, etc.  Please have this removed prior to surgery.  If it is not removed, there is a chance that hospital personnel will need to cut it off on the day of surgery.  Do not wear lotions, powders, or perfumes.   Do not shave body hair from the neck down 48 hours before surgery.  Contact lenses, hearing aids and dentures may not be worn into surgery.  Do not bring valuables to the hospital. [facility] is not responsible for any missing/lost belongings or valuables.   Bring your C-PAP to  the hospital in case you may have to spend the night.   Notify your doctor if there is any change in your medical condition (cold, fever, infection).  Wear comfortable clothing (specific to your surgery type) to the hospital.  After surgery, you can help prevent lung complications by doing breathing exercises.  Take deep breaths and cough every 1-2 hours. Your doctor may order a device called an Incentive Spirometer to help you take deep  breaths.  If you are being admitted to the hospital overnight, leave your suitcase in the car. After surgery it may be brought to your room.  In case of increased patient census, it may be necessary for you, the patient, to continue your postoperative care in the Same Day Surgery department.  If you are being discharged the day of surgery, you will not be allowed to drive home. You will need a responsible individual to drive you home and stay with you for 24 hours after surgery.   If you are taking public transportation, you will need to have a responsible individual with you.  Please call the Pre-admissions Testing Dept. at [phone] if you have any questions about these instructions.  Surgery Visitation Policy:  Patients having surgery or a procedure may have two visitors.  Children under the age of 74 must have an adult with them who is not the patient.  Temporary Visitor Restrictions Due to increasing cases of flu, RSV and COVID-19: Children ages 60 and under will not be able to visit patients in [facility] hospitals under most circumstances.  Inpatient Visitation:    Visiting hours are 7 a.m. to 8 p.m. Up to four visitors are allowed at one time in a patient room. The visitors may rotate out with other people during the day.  One visitor age 23 or older may stay with the patient overnight and must be in the room by 8 p.m.    Pre-operative 5 CHG Bath Instructions   You can play a key role in reducing the risk of infection after surgery. Your skin needs to be as free of germs as possible. You can reduce the number of germs on your skin by washing with CHG (chlorhexidine gluconate) soap before surgery. CHG is an antiseptic soap that kills germs and continues to kill germs even after washing.   DO NOT use if you have an allergy to chlorhexidine/CHG or antibacterial soaps. If your skin becomes reddened or irritated, stop using the CHG and notify one of our RNs at [phone].    Please shower with the CHG soap starting 4 days before surgery using the following schedule:     Please keep in mind the following:  DO NOT shave, including legs and underarms, starting the day of your first shower.   You may shave your face at any point before/day of surgery.  Place clean sheets on your bed the day you start using CHG soap. Use a clean washcloth (not used since being washed) for each shower. DO NOT sleep with pets once you start using the CHG.   CHG Shower Instructions:  If you choose to wash your hair and private area, wash first with your normal shampoo/soap.  After you use shampoo/soap, rinse your hair and body thoroughly to remove shampoo/soap residue.  Turn the water OFF and apply about 3 tablespoons (45 ml) of CHG soap to a CLEAN washcloth.  Apply CHG soap ONLY FROM YOUR NECK DOWN TO YOUR TOES (washing for 3-5 minutes)  DO NOT  use CHG soap on face, private areas, open wounds, or sores.  Pay special attention to the area where your surgery is being performed.  If you are having back surgery, having someone wash your back for you may be helpful. Wait 2 minutes after CHG soap is applied, then you may rinse off the CHG soap.  Pat dry with a clean towel  Put on clean clothes/pajamas   If you choose to wear lotion, please use ONLY the CHG-compatible lotions on the back of this paper.     Additional instructions for the day of surgery: DO NOT APPLY any lotions, deodorants, cologne, or perfumes.   Put on clean/comfortable clothes.  Brush your teeth.  Ask your nurse before applying any prescription medications to the skin.      CHG Compatible Lotions   Aveeno Moisturizing lotion  Cetaphil Moisturizing Cream  Cetaphil Moisturizing Lotion  Clairol Herbal Essence Moisturizing Lotion, Dry Skin  Clairol Herbal Essence Moisturizing Lotion, Extra Dry Skin  Clairol Herbal Essence Moisturizing Lotion, Normal Skin  Curel Age Defying Therapeutic Moisturizing Lotion  with Alpha Hydroxy  Curel Extreme Care Body Lotion  Curel Soothing Hands Moisturizing Hand Lotion  Curel Therapeutic Moisturizing Cream, Fragrance-Free  Curel Therapeutic Moisturizing Lotion, Fragrance-Free  Curel Therapeutic Moisturizing Lotion, Original Formula  Eucerin Daily Replenishing Lotion  Eucerin Dry Skin Therapy Plus Alpha Hydroxy Crme  Eucerin Dry Skin Therapy Plus Alpha Hydroxy Lotion  Eucerin Original Crme  Eucerin Original Lotion  Eucerin Plus Crme Eucerin Plus Lotion  Eucerin TriLipid Replenishing Lotion  Keri Anti-Bacterial Hand Lotion  Keri Deep Conditioning Original Lotion Dry Skin Formula Softly Scented  Keri Deep Conditioning Original Lotion, Fragrance Free Sensitive Skin Formula  Keri Lotion Fast Absorbing Fragrance Free Sensitive Skin Formula  Keri Lotion Fast Absorbing Softly Scented Dry Skin Formula  Keri Original Lotion  Keri Skin Renewal Lotion Keri Silky Smooth Lotion  Keri Silky Smooth Sensitive Skin Lotion  Nivea Body Creamy Conditioning Oil  Nivea Body Extra Enriched Lotion  Nivea Body Original Lotion  Nivea Body Sheer Moisturizing Lotion Nivea Crme  Nivea Skin Firming Lotion  NutraDerm 30 Skin Lotion  NutraDerm Skin Lotion  NutraDerm Therapeutic Skin Cream  NutraDerm Therapeutic Skin Lotion  ProShield Protective Hand Cream  Provon moisturizing lotion  Preoperative Educational Videos for Total Hip, Knee and Shoulder Replacements  To better prepare for surgery, please view our videos that explain the physical activity and discharge planning required to have the best surgical recovery at [facility].  [email or web address]  Questions? Call [phone] or email [email or web address]

## 2023-04-02 ENCOUNTER — Encounter: Payer: Self-pay | Admitting: Orthopedic Surgery

## 2023-04-02 NOTE — Progress Notes (Signed)
 Perioperative / Anesthesia Services  Pre-Admission Testing Clinical Review / Pre-Operative Anesthesia Consult  Date: 04/02/23  Patient Demographics:  Name: Helen Garcia DOB: [birth year] MRN:   [id number]  Planned Surgical Procedure(s):    Case: [id number] Date/Time: 04/08/23 0815   Procedure: ARTHROPLASTY, KNEE, TOTAL, USING IMAGELESS COMPUTER-ASSISTED NAVIGATION (Left: Knee)   Anesthesia type: Choice   Diagnosis: Primary osteoarthritis of left knee [M17.12]   Pre-op diagnosis: PRIMARY OSTEOARTHRITIS OF LEFT KNEE   Location: [facility] OR ROOM 01 / [facility]   Surgeons: Donato Heinz, MD      NOTE: Available PAT nursing documentation and vital signs have been reviewed. Clinical nursing staff has updated patient's PMH/PSHx, current medication list, and drug allergies/intolerances to ensure comprehensive history available to assist in medical decision making as it pertains to the aforementioned surgical procedure and anticipated anesthetic course. Extensive review of available clinical information personally performed. Helen Garcia PMH and PSHx updated with any diagnoses/procedures that  may have been inadvertently omitted during his intake with the pre-admission testing department's nursing staff.  Clinical Discussion:  Helen Garcia is a 63 y.o. female who is submitted for pre-surgical anesthesia review and clearance prior to her undergoing the above procedure. Patient is a Former Games developer. 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  Patient is followed by cardiology Azucena Kuba, MD). She was last seen in the cardiology clinic on 02/24/2023; notes reviewed. At the time of her clinic visit, patient with complaints of progressive DOE with short distance  ambulation.  She had been seen in consult by cardiopulmonary with a benign workup other than PFTs that demonstrated mild airway obstruction.  Cardiopulmonary rehab and weight loss was recommended. Patient denied any chest pain, PND, orthopnea, palpitations, significant peripheral edema, weakness, fatigue, vertiginous symptoms, or presyncope/syncope. Patient with a past medical history significant for cardiovascular diagnoses. Documented physical exam was grossly benign, providing no evidence of acute exacerbation and/or decompensation of the patient's known cardiovascular conditions.  Most recent TTE performed on 03/17/2018 revealed a normal left ventricular systolic function with an EF of >55% %. There was mild concentric LVH.  There were no regional wall motion abnormalities.  Left ventricular diastolic Doppler parameters indeterminant. GLS -21.8%. Right ventricular size and function normal with a TAPSE measuring 2.4 cm  (normal range >/= 1.6 cm).  There was insufficient TR jet to estimate RVSP.  There was trivial valve regurgitation.  Saline microcavitation is normal after Valsalva.  All transvalvular gradients were noted to be normal providing no evidence suggestive of valvular stenosis. Aorta normal in size with no evidence of ectasia or aneurysmal dilatation.  Most recent myocardial perfusion imaging study was performed on 03/22/2018 revealing a  normal left ventricular systolic function with a hyperdynamic post-rest LVEF of 87%..  There were no regional wall motion abnormalities.  No artifact or left ventricular cavity size enlargement appreciated on review of imaging. SPECT images demonstrated no evidence of stress-induced myocardial ischemia or arrhythmia; no scintigraphic evidence of scar. Study determined to be normal and low risk.  Blood pressure mildly elevated at 143/80 mmHg on currently prescribed diuretic (spironolactone) monotherapy.  Patient is not on any type of lipid-lowering therapies for  her HLD diagnosis and further ASCVD prevention.  Given her cardiovascular diagnoses, patient is on an SGLT2i (dapagliflozin) for added cardiovascular and renovascular protection.Patient is not diabetic.  Patient does have an  OSAH diagnosis and is reported to be compliant with prescribed nocturnal PAP therapy.  Functional capacity decreased due to patient's progressive shortness of breath and arthritides.  With that said, patient able to complete all of her ADLs/IADLs without significant cardiovascular limitation.  Per the [facility]AS[facility], patient is felt to be able to exceed 4 METS of physical activity without experiencing any significant degree of angina/anginal equivalent symptoms.  Given patient's history of negative cardiovascular workup in the past, coupled with her current progressive dyspnea, discussed potential need for cardiac MRI to assess for/rule out infiltrative disease such as cardiac amyloidosis or sarcoidosis.  No changes were made to her medication regimen during her visit with cardiology.  Patient scheduled to follow-up with outpatient cardiology in 3 months or sooner if needed.  Helen Garcia is scheduled for an elective ARTHROPLASTY, KNEE, TOTAL, USING IMAGELESS COMPUTER-ASSISTED NAVIGATION (Left: Knee) on 04/08/2023 with Dr. Francesco Sor, MD. Given patient's past medical history significant for cardiovascular diagnoses, presurgical cardiac clearance was sought by the PAT team. Per cardiology, "this patient is optimized for surgery and may proceed with the planned procedural course with a MODERATE risk of significant perioperative cardiovascular complications".  In review of her medication reconciliation, the patient is not noted to be taking any type of anticoagulation or antiplatelet therapies that would need to be held during her perioperative course.  Patient reports previous perioperative complications with anesthesia in the past. Patient has a PMH (+) for PONV. Symptoms and history of PONV  will be discussed with patient by anesthesia team on the day of her procedure. Interventions will be ordered as deemed necessary based on patient's individual care needs as determined by anesthesiologist. In review her EMR, it is noted that patient underwent a general anesthetic course at [facility] in 07/2022 without documented complications.      03/30/2023    8:59 AM 03/25/2023   10:03 AM 11/22/2022    5:08 PM  Vitals with BMI  Height 5\' 5"  5\' 5"    Weight 246 lbs 244 lbs 8 oz   BMI 40.94 40.69   Systolic 130 127 130  Diastolic 92 74 74  Pulse 72 79 71   Providers/Specialists:  NOTE: Primary physician provider listed below. Patient may have been seen by APP or partner within same practice.   PROVIDER ROLE / SPECIALTY LAST OV  Hooten, Illene Labrador, MD Orthopedics (Surgeon) 03/30/2023  Marisue Ivan, MD Primary Care Provider 03/04/2023  Marguarite Arbour, MD Cardiology 02/24/2023  Gerrie Nordmann, MD Rheumatology 01/14/2023   Allergies:   Allergies  Allergen Reactions   Abatacept Hives and Itching   Codeine Nausea And Vomiting and Other (See Comments)    GI upset   Tomato Rash    Upset stomach   Current Home Medications:   No current facility-administered medications for this encounter.    clotrimazole-betamethasone (LOTRISONE) cream   colestipol (COLESTID) 1 g tablet   COSENTYX SENSOREADY, 300 MG, 150 MG/ML SOAJ   FARXIGA 10 MG TABS tablet   Multiple Vitamin (MULTIVITAMIN WITH MINERALS) TABS tablet   naproxen (NAPROSYN) 500 MG tablet   oxyCODONE-acetaminophen (PERCOCET/ROXICET) 5-325 MG tablet   spironolactone (ALDACTONE) 25 MG tablet   SYNTHROID 112 MCG tablet   History:   Past Medical History:  Diagnosis Date   Age-related osteoporosis without current pathological fracture    Aortic atherosclerosis (HCC)    Cavernous hemangioma of liver 2017   Cervical spondylosis    Chronic heart failure with preserved ejection fraction (HCC)  Degenerative  arthritis of left knee    Depression    DOE (dyspnea on exertion)    Erythrocytosis    GERD (gastroesophageal reflux disease)    Glaucoma    Hepatic steatosis    History of hiatal hernia    Hypothyroidism    IBS (irritable bowel syndrome)    Insomnia    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)    Long term (current) use of immunosuppressive biologic    a.) secukinumab for psoriatic arthritis   Morbid obesity with BMI of 40.0-44.9, adult (HCC)    OSA on CPAP    Osteoarthritis    Plaque psoriasis    Polycythemia, secondary    PONV (postoperative nausea and vomiting)    Psoriatic arthritis (HCC)    a.) Tx'd with secukinumab   Tenosynovitis of fingers    Uveitis    Vasovagal episode    Past Surgical History:  Procedure Laterality Date   ABDOMINAL HYSTERECTOMY     ANKLE ARTHROSCOPY Right 08/17/2020   Procedure: ANKLE ARTHROSCOPY- OCD repair; debridement, extensive;  Surgeon: Gwyneth Revels, DPM;  Location: [facility] ORS;  Service: Podiatry;  Laterality: Right;   ANKLE RECONSTRUCTION Right 08/17/2020   Procedure: RECONSTRUCTION ANKLE- Brostrum-Gould;  Surgeon: Gwyneth Revels, DPM;  Location: [facility] ORS;  Service: Podiatry;  Laterality: Right;   APPENDECTOMY     CHOLECYSTECTOMY     EYE SURGERY Left 1974   eye muscle   HAMMER TOE SURGERY Left 07/23/2022   Procedure: HAMMER TOE CORRECTION - SECOND AND THIRD;  Surgeon: Gwyneth Revels, DPM;  Location: [facility];  Service: Podiatry;  Laterality: Left;   KNEE ARTHROSCOPY WITH MEDIAL MENISECTOMY Left 03/12/2017   Procedure: KNEE ARTHROSCOPY WITH MEDIAL MENISECTOMY;  Surgeon: Kennedy Bucker, MD;  Location: [facility] ORS;  Service: Orthopedics;  Laterality: Left;   TENDON REPAIR Right 08/17/2020   Procedure: TENDON REPAIR- Flexor tendon repair-second; Tenolysis, multiple;  Surgeon: Gwyneth Revels, DPM;  Location: [facility] ORS;  Service: Podiatry;  Laterality: Right;   TONSILLECTOMY     Family History  Problem Relation Age of Onset   Multiple  sclerosis Mother    Cirrhosis Father    Breast cancer Neg Hx    Social History   Tobacco Use   Smoking status: Former    Types: Cigarettes   Smokeless tobacco: Never  Substance Use Topics   Alcohol use: No   Pertinent Clinical Results:  LABS:  Hospital Outpatient Visit on 03/30/2023  Component Date Value Ref Range Status   WBC 03/30/2023 6.8  4.0 - 10.5 K/uL Final   RBC 03/30/2023 5.57 (H)  3.87 - 5.11 MIL/uL Final   Hemoglobin 03/30/2023 16.7 (H)  12.0 - 15.0 g/dL Final   HCT 16/10/9602 49.6 (H)  36.0 - 46.0 % Final   MCV 03/30/2023 89.0  80.0 - 100.0 fL Final   MCH 03/30/2023 30.0  26.0 - 34.0 pg Final   MCHC 03/30/2023 33.7  30.0 - 36.0 g/dL Final   RDW 54/09/8117 12.7  11.5 - 15.5 % Final   Platelets 03/30/2023 242  150 - 400 K/uL Final   nRBC 03/30/2023 0.0  0.0 - 0.2 % Final   Performed at [facility], [address] [place], [place] [address]   Sodium 03/30/2023 140  135 - 145 mmol/L Final   Potassium 03/30/2023 4.3  3.5 - 5.1 mmol/L Final   Chloride 03/30/2023 104  98 - 111 mmol/L Final   CO2 03/30/2023 30  22 - 32 mmol/L Final   Glucose, Bld  03/30/2023 107 (H)  70 - 99 mg/dL Final   Glucose reference range applies only to samples taken after fasting for at least 8 hours.   BUN 03/30/2023 17  8 - 23 mg/dL Final   Creatinine, Ser 03/30/2023 0.80  0.44 - 1.00 mg/dL Final   Calcium 96/04/5407 10.1  8.9 - 10.3 mg/dL Final   Total Protein 81/19/1478 6.8  6.5 - 8.1 g/dL Final   Albumin 29/56/2130 4.0  3.5 - 5.0 g/dL Final   AST 86/57/8469 25  15 - 41 U/L Final   ALT 03/30/2023 28  0 - 44 U/L Final   Alkaline Phosphatase 03/30/2023 99  38 - 126 U/L Final   Total Bilirubin 03/30/2023 0.9  0.0 - 1.2 mg/dL Final   GFR, Estimated 03/30/2023 >60  >60 mL/min Final   Comment: (NOTE) Calculated using the CKD-EPI Creatinine Equation (2021)    Anion gap 03/30/2023 6  5 - 15 Final   Performed at [facility], [address] [place], [place] [address]    Color, Urine 03/30/2023 YELLOW (A)  YELLOW Final   APPearance 03/30/2023 CLOUDY (A)  CLEAR Final   Specific Gravity, Urine 03/30/2023 1.021  1.005 - 1.030 Final   pH 03/30/2023 5.0  5.0 - 8.0 Final   Glucose, UA 03/30/2023 >=500 (A)  NEGATIVE mg/dL Final   Hgb urine dipstick 03/30/2023 SMALL (A)  NEGATIVE Final   Bilirubin Urine 03/30/2023 NEGATIVE  NEGATIVE Final   Ketones, ur 03/30/2023 NEGATIVE  NEGATIVE mg/dL Final   Protein, ur 84/13/2440 NEGATIVE  NEGATIVE mg/dL Final   Nitrite 11/02/2534 NEGATIVE  NEGATIVE Final   Leukocytes,Ua 03/30/2023 NEGATIVE  NEGATIVE Final   RBC / HPF 03/30/2023 0-5  0 - 5 RBC/hpf Final   WBC, UA 03/30/2023 0-5  0 - 5 WBC/hpf Final   Bacteria, UA 03/30/2023 NONE SEEN  NONE SEEN Final   Squamous Epithelial / HPF 03/30/2023 11-20  0 - 5 /HPF Final   Mucus 03/30/2023 PRESENT   Final   Performed at [facility], [address] [place], [place] [address]   CRP 03/30/2023 0.7  <1.0 mg/dL Final   Performed at [facility], [address], [place], [place] [address]   Sed Rate 03/30/2023 2  0 - 30 mm/hr Final   Performed at [facility], [address] [place], [place] [address]   MRSA, PCR 03/30/2023 NEGATIVE  NEGATIVE Final   Staphylococcus aureus 03/30/2023 NEGATIVE  NEGATIVE Final   Comment: (NOTE) The Xpert SA Assay (FDA approved for NASAL specimens in patients 60 years of age and older), is one component of a comprehensive surveillance program. It is not intended to diagnose infection nor to guide or monitor treatment. Performed at [facility], [address] [place], [place] [address]     ECG: Date: 02/24/2023  Time ECG obtained: 1106 AM Rate: 74 bpm Rhythm: normal sinus Axis (leads I and aVF): right Intervals: PR 188 ms. QRS 76 ms. QTc 437 ms. ST segment and T wave changes: No evidence of acute T wave abnormalities or significant ST segment elevation or depression.  Evidence of a possible, age  undetermined, prior infarct:  No Comparison: Similar to previous tracing obtained on 11/22/2022   IMAGING / PROCEDURES: MR BRAIN WO CONTRAST performed on 11/22/2022 No acute infarction, hemorrhage, hydrocephalus, extra-axial collection or mass lesion. Normal brain MRI.  No acute abnormality.   MYOCARDIAL PERFUSION IMAGING STUDY (LEXISCAN) performed on 03/22/2018 Normal left ventricular systolic function with a  hyperdynamic post stress LVEF of 87% Normal myocardial thickening and wall motion Left ventricular cavity size normal SPECT images demonstrate homogenous tracer distribution throughout the myocardium No evidence of stress-induced myocardial ischemia or arrhythmia Normal low risk study  TRANSTHORACIC ECHOCARDIOGRAM WITH BUBBLE STUDY performed on 03/17/2018 Normal left ventricular systolic function with an EF of >55% Mild concentric LVH No regional wall motion abnormalities Diastolic Doppler parameters indeterminant Left ventricular GLS -21.8% Normal right ventricular systolic function Trivial TR Insufficient TR to estimate RVSP Normal gradients; no valvular stenosis Saline microcavitation's are normal after Valsalva  Impression and Plan:  Helen Garcia has been referred for pre-anesthesia review and clearance prior to her undergoing the planned anesthetic and procedural courses. Available labs, pertinent testing, and imaging results were personally reviewed by me in preparation for upcoming operative/procedural course. [facility] medical record has been updated following extensive record review and patient interview with PAT staff.   This patient has been appropriately cleared by cardiology with an overall MODERATE risk of experiencing significant perioperative cardiovascular complications. Based on clinical review performed today (04/02/23), barring any significant acute changes in the patient's overall condition, it is anticipated that she will be able to proceed with  the planned surgical intervention. Any acute changes in clinical condition may necessitate her procedure being postponed and/or cancelled. Patient will meet with anesthesia team (MD and/or CRNA) on the day of her procedure for preoperative evaluation/assessment. Questions regarding anesthetic course will be fielded at that time.   Pre-surgical instructions were reviewed with the patient during his PAT appointment, and questions were fielded to satisfaction by PAT clinical staff. She has been instructed on which medications that she will need to hold prior to surgery, as well as the ones that have been deemed safe/appropriate to take on the day of his procedure. As part of the general education provided by PAT, patient made aware both verbally and in writing, that she would need to abstain from the use of any illegal substances during his perioperative course. She was advised that failure to follow the provided instructions could necessitate case cancellation or result in serious perioperative complications up to and including death. Patient encouraged to contact PAT and/or her surgeon's office to discuss any questions or concerns that may arise prior to surgery; verbalized understanding.   Quentin Mulling, MSN, APRN, FNP-C, [facility]  Perioperative Services Nurse Practitioner Phone: [phone] Fax: [phone] 04/02/23 10:56 AM  NOTE: This note has been prepared using Dragon dictation software. Despite my best ability to proofread, there is always the potential that unintentional transcriptional errors may still occur from this process.

## 2023-04-05 ENCOUNTER — Encounter: Payer: Self-pay | Admitting: Orthopedic Surgery

## 2023-04-05 NOTE — H&P (Signed)
 ORTHOPAEDIC HISTORY & PHYSICAL Raenette Rover, [place] - 03/30/2023 3:45 PM EDT Formatting of this note is different from the original. NAME: Helen Garcia H&P Date: 03/30/2023 Procedure Date: 04/08/2023  Chief Complaint: left knee pain  HPI Helen Garcia is a 63 y.o. female who has severe Left knee pain. Patient reports a longstanding history of left knee pain as well as former knee issues. She states that she underwent a medial meniscectomy approximately 6 years ago, performed by Dr. Rosita Kea. She states that her symptoms have gotten worse as time has progressed. She states that most of the pain localizes along the medial aspect of her left knee. She does report intermittent swelling and giving way of the knee. Denies any locking symptoms. She states the pain is made worse with any attempted weightbearing or prolonged ambulation. It limits her ability to ambulate long distances and perform her ADLs as she would like. She has failed conservative treatment including NSAIDs, bracing, physical therapy, and activity modification. She presents today not utilizing any ambulatory aid, but states that she uses a cane intermittently. 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 Patient does have a recent diagnosis of stage III heart failure for which she sees her cardiologist and has been approved for surgery. She denies any pulmonary issues, but states that she is prone to upper respiratory infection secondary to utilizing a monoclonal antibody. Patient is seen by Dr. Allena Katz in rheumatology for psoriatic arthritis and receives Cosentyx (secukinumab). She denies any histories of DVTs or clots. She is not a diabetic.  Of note, patient does also have a history of polycythemia for which she sees a hematologist and was cleared last week for surgery.  Social Hx: Patient states that she lives alone, but will have some neighbors/friends helping look after her for the first few days  after surgery. She did express that she may need to extend her home health physical therapy past 2 weeks as she would not be able to drive to outpatient physical therapy. 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 She denies any alcohol, nicotine, tobacco use or smoking. She does admit to intermittent marijuana use ("gummies") which she states she does not take consistently, states her last use was maybe 4 months ago.  Medications & Allergies Allergies: Allergies Allergen Reactions Orencia [Abatacept (With Maltose)] Hives and Itching Abatacept Other (See Comments) Codeine Nausea GI upset Tomato Nausea Upset stomach   Home Medicines: Current Outpatient Medications on File Prior to Visit Medication Sig Dispense Refill CALCIUM CARBONATE ORAL Take 750 mg of elemental by mouth once daily cholecalciferol (VITAMIN D3) 2,000 unit tablet Take 2,000 Units by mouth once daily clotrimazole-betamethasone (LOTRISONE) 1-0.05 % cream Apply topically 2 (two) times daily APPLY TO AFFECTED AREA (Patient taking differently: Apply topically 2 (two) times daily as needed APPLY TO AFFECTED AREA) 45 g 1 colestipoL (COLESTID) 1 gram tablet Take 2 tablets (2 g total) by mouth 3 (three) times daily 180 tablet 11 cyanocobalamin, vitamin B-12, (VITAMIN B12 ORAL) Take by mouth once daily FARXIGA 10 mg tablet Take 1 tablet (10 mg total) by mouth once daily 90 tablet 3 levothyroxine (SYNTHROID) 112 MCG tablet TAKE 1 TABLET (112 MCG TOTAL) BY MOUTH ONCE DAILY 100 tablet 1 miscellaneous medical supply Misc Place 1 each into one nostril at bedtime CPAP multivitamin tablet Take 1 tablet by mouth once daily secukinumab 150 mg/mL PnIj Inject 300 mg subcutaneously every 28 (twenty-eight) days 6 mL 3 spironolactone (ALDACTONE) 25 MG tablet  Take 1 tablet (25 mg total) by mouth once daily 90 tablet 3  No current facility-administered medications on file prior to visit.  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/21/2015 Cervical spondylosis Depression 2014 Erythrocytosis 09/21/2015 Gastroesophageal reflux disease without esophagitis 12/24/2015 Glaucoma (increased eye pressure) Hyperlipidemia, mixed 03/11/2018 Irritable bowel syndrome OSA (obstructive sleep apnea - sleep study 12/17/18) 01/03/2019 Osteoarthritis Polycythemia secondary Prediabetes Psoriatic arthritis (CMS/HHS-HCC) Thyroid disease Uveitis   Past Surgical History: Procedure Laterality Date knee arthroscopy with medial menisectomy Left 03/12/2017 Dr.Menz ankle surgery Right 2022 APPENDECTOMY CHOLECYSTECTOMY EYE SURGERY Eye Muscle HYSTERECTOMY TONSILLECTOMY   Physical Exam  Ht: Wt: BMI: There is no height or weight on file to calculate BMI.  General/Constitutional: No apparent distress: well-nourished and well developed. Eyes: Pupils equal, round with synchronous movement. Lymphatic: No palpable adenopathy. Respiratory: Patient has good chest rise and fall with inspiration and expiration. All lung fields are clear to auscultation bilaterally. There is no Rales, rhonchi or wheezes appreciated. Cardiovascular: Upon auscultation there is a regular rate and rhythm without any murmurs, rubs, gallops or heaves appreciated. There does not appear to be any swelling down the lower extremities. Posterior tibial pulses appreciated bilaterally, 2+. Integumentary: No impressive skin lesions present, except as noted in detailed exam. Neuro/Psych: Normal mood and affect, oriented to person, place and time. Musculoskeletal: see exam below  Left knee exam Upon inspection of the patient's left knee there does not appear to be any skin changes, open abrasions, swelling or redness. There is a slight varus alignment. Upon palpation, the patient reports having pain along the  medial aspect of their knee. Patient has full extension actively with ROM, and able to flex back to 120 degrees with mild pain. Varus and valgus stress testing shows a stable knee. The patella tracks well within the femoral groove from flexion into extension with mild crepitus appreciated. Anterior and posterior drawer testing negative. Patient is neurovascularly intact down their lower extremity to all dermatomes. Posterior tibial pulses appreciated 2+.  Imaging left Knee Imaging: None ordered today. Previous images from 02/03/2023 were reviewed. There does appear to be some narrowing of cartilage space, most notably along the medial aspect. Approximately 80% medial narrowing appreciated. Subchondral changes are noted. Small osteophytes present off of the medial femoral epicondyle and tibial plateau. Overall alignment is neutral. No fractures, lytic lesions or gross deformities appreciated on films.  Assesment and Plan Knee DJD  I have recommended that [facility] undergo left total knee replacement. Consents has been signed. The risks, benefits, prognosis and alternatives including but not limited to DVT, PE, infection, neurovascular injury, failure of the procedure and death were explained to the patient and she is willing to proceed with surgery as described to her by myself. Plan will be for post operative admission of at least 1 midnight for pain control and PT. She will be managed with DVT prophylaxis, antibiotics preoperatively for 24 hours and aggressive in patient rehab.  Pre, intra and post op interventions were discussed. Patient has good understanding  Medication Reconciliation was performed. Discussed cessation of colestipol, Farxiga, spironolactone, Cosentyx, vitamins and supplements. Patient states that she last took her Cosentyx over a month ago.  A total of 45 minutes was spent reviewing patient's charts, medical reconciliation, discussing/educating the patient about surgical  interventions, and answering any questions provided by the patient.  JOSHUA Kendrick Fries, [place] [facility] 03/30/2023  Electronically signed by Raenette Rover, PA at 03/30/2023 6:26 PM EDT

## 2023-04-08 ENCOUNTER — Encounter: Payer: Self-pay | Admitting: Orthopedic Surgery

## 2023-04-08 ENCOUNTER — Observation Stay
Admission: RE | Admit: 2023-04-08 | Discharge: 2023-04-09 | Disposition: A | Discharge status: Home / Self Care | Attending: Orthopedic Surgery | Admitting: Orthopedic Surgery

## 2023-04-08 ENCOUNTER — Other Ambulatory Visit: Payer: Self-pay

## 2023-04-08 ENCOUNTER — Encounter
Admission: RE | Disposition: A | Discharge status: Home / Self Care | Payer: Self-pay | Source: Home / Self Care | Attending: Orthopedic Surgery

## 2023-04-08 ENCOUNTER — Observation Stay

## 2023-04-08 ENCOUNTER — Ambulatory Visit: Payer: Self-pay | Admitting: Urgent Care

## 2023-04-08 DIAGNOSIS — Z01812 Encounter for preprocedural laboratory examination: Secondary | ICD-10-CM

## 2023-04-08 DIAGNOSIS — D751 Secondary polycythemia: Secondary | ICD-10-CM

## 2023-04-08 DIAGNOSIS — L4050 Arthropathic psoriasis, unspecified: Secondary | ICD-10-CM

## 2023-04-08 DIAGNOSIS — M1712 Unilateral primary osteoarthritis, left knee: Secondary | ICD-10-CM | POA: Diagnosis present

## 2023-04-08 DIAGNOSIS — I110 Hypertensive heart disease with heart failure: Secondary | ICD-10-CM | POA: Diagnosis not present

## 2023-04-08 DIAGNOSIS — I5032 Chronic diastolic (congestive) heart failure: Secondary | ICD-10-CM | POA: Diagnosis not present

## 2023-04-08 DIAGNOSIS — Z96652 Presence of left artificial knee joint: Secondary | ICD-10-CM

## 2023-04-08 DIAGNOSIS — R5382 Chronic fatigue, unspecified: Secondary | ICD-10-CM

## 2023-04-08 HISTORY — DX: Atherosclerosis of aorta: I70.0

## 2023-04-08 HISTORY — DX: Essential (primary) hypertension: I10

## 2023-04-08 HISTORY — DX: Other forms of dyspnea: R06.09

## 2023-04-08 HISTORY — DX: Unspecified synovitis and tenosynovitis, unspecified hand: M65.949

## 2023-04-08 HISTORY — DX: Insomnia, unspecified: G47.00

## 2023-04-08 HISTORY — DX: Hyperlipidemia, unspecified: E78.5

## 2023-04-08 HISTORY — DX: Psoriasis vulgaris: L40.0

## 2023-04-08 HISTORY — PX: KNEE ARTHROPLASTY: SHX992

## 2023-04-08 SURGERY — ARTHROPLASTY, KNEE, TOTAL, USING IMAGELESS COMPUTER-ASSISTED NAVIGATION
Anesthesia: Spinal | Site: Knee | Laterality: Left

## 2023-04-08 MED ORDER — ACETAMINOPHEN 325 MG PO TABS: 325.0000 mg | ORAL_TABLET | Freq: Four times a day (QID) | ORAL | Status: DC | PRN

## 2023-04-08 MED ORDER — PHENYLEPHRINE HCL-NACL 20-0.9 MG/250ML-% IV SOLN
INTRAVENOUS | Status: DC | PRN
  Administered 2023-04-08: 30 ug/min via INTRAVENOUS

## 2023-04-08 MED ORDER — DROPERIDOL 2.5 MG/ML IJ SOLN: 0.6250 mg | Freq: Once | INTRAMUSCULAR | Status: DC | PRN

## 2023-04-08 MED ORDER — OXYCODONE HCL 5 MG/5ML PO SOLN: 5.0000 mg | Freq: Once | ORAL | Status: AC | PRN

## 2023-04-08 MED ORDER — PROPOFOL 1000 MG/100ML IV EMUL
INTRAVENOUS | Status: AC
  Filled 2023-04-08: qty 100

## 2023-04-08 MED ORDER — ENSURE PRE-SURGERY PO LIQD
296.0000 mL | Freq: Once | ORAL | Status: AC
  Administered 2023-04-08: 296 mL via ORAL
  Filled 2023-04-08: qty 296

## 2023-04-08 MED ORDER — FENTANYL CITRATE (PF) 100 MCG/2ML IJ SOLN
INTRAMUSCULAR | Status: DC | PRN
  Administered 2023-04-08: 25 ug via INTRAVENOUS

## 2023-04-08 MED ORDER — CHLORHEXIDINE GLUCONATE 0.12 % MT SOLN
15.0000 mL | Freq: Once | OROMUCOSAL | Status: AC
  Administered 2023-04-08: 15 mL via OROMUCOSAL

## 2023-04-08 MED ORDER — MAGNESIUM HYDROXIDE 400 MG/5ML PO SUSP
30.0000 mL | Freq: Every day | ORAL | Status: DC
  Administered 2023-04-08: 30 mL via ORAL
  Filled 2023-04-08: qty 30

## 2023-04-08 MED ORDER — MIDAZOLAM HCL 5 MG/5ML IJ SOLN
INTRAMUSCULAR | Status: DC | PRN
  Administered 2023-04-08: 2 mg via INTRAVENOUS

## 2023-04-08 MED ORDER — TRANEXAMIC ACID-NACL 1000-0.7 MG/100ML-% IV SOLN
1000.0000 mg | Freq: Once | INTRAVENOUS | Status: AC
  Administered 2023-04-08: 1000 mg via INTRAVENOUS

## 2023-04-08 MED ORDER — SODIUM CHLORIDE 0.9 % IR SOLN
Status: DC | PRN
  Administered 2023-04-08: 3000 mL

## 2023-04-08 MED ORDER — CEFAZOLIN SODIUM-DEXTROSE 2-4 GM/100ML-% IV SOLN
INTRAVENOUS | Status: AC
  Filled 2023-04-08: qty 100

## 2023-04-08 MED ORDER — CELECOXIB 200 MG PO CAPS
400.0000 mg | ORAL_CAPSULE | Freq: Once | ORAL | Status: AC
  Administered 2023-04-08: 400 mg via ORAL

## 2023-04-08 MED ORDER — FLEET ENEMA RE ENEM: 1.0000 | ENEMA | Freq: Once | RECTAL | Status: DC | PRN

## 2023-04-08 MED ORDER — ACETAMINOPHEN 10 MG/ML IV SOLN
INTRAVENOUS | Status: AC
  Filled 2023-04-08: qty 100

## 2023-04-08 MED ORDER — FENTANYL CITRATE (PF) 100 MCG/2ML IJ SOLN: 25.0000 ug | INTRAMUSCULAR | Status: DC | PRN

## 2023-04-08 MED ORDER — OXYCODONE HCL 5 MG PO TABS
5.0000 mg | ORAL_TABLET | Freq: Once | ORAL | Status: AC | PRN
  Administered 2023-04-08: 5 mg via ORAL

## 2023-04-08 MED ORDER — FERROUS SULFATE 325 (65 FE) MG PO TABS
325.0000 mg | ORAL_TABLET | Freq: Two times a day (BID) | ORAL | Status: DC
  Filled 2023-04-08 (×2): qty 1

## 2023-04-08 MED ORDER — SURGIPHOR WOUND IRRIGATION SYSTEM - OPTIME: TOPICAL | Status: DC | PRN

## 2023-04-08 MED ORDER — TRAMADOL HCL 50 MG PO TABS
50.0000 mg | ORAL_TABLET | ORAL | Status: DC | PRN
  Administered 2023-04-08: 100 mg via ORAL
  Administered 2023-04-08 – 2023-04-09 (×2): 50 mg via ORAL
  Filled 2023-04-08 (×2): qty 1
  Filled 2023-04-08: qty 2

## 2023-04-08 MED ORDER — CEFAZOLIN SODIUM-DEXTROSE 2-4 GM/100ML-% IV SOLN
2.0000 g | INTRAVENOUS | Status: AC
  Administered 2023-04-08: 2 g via INTRAVENOUS

## 2023-04-08 MED ORDER — ACETAMINOPHEN 10 MG/ML IV SOLN
1000.0000 mg | Freq: Four times a day (QID) | INTRAVENOUS | Status: DC
  Administered 2023-04-08 – 2023-04-09 (×3): 1000 mg via INTRAVENOUS
  Filled 2023-04-08 (×3): qty 100

## 2023-04-08 MED ORDER — TRANEXAMIC ACID-NACL 1000-0.7 MG/100ML-% IV SOLN
1000.0000 mg | INTRAVENOUS | Status: AC
  Administered 2023-04-08: 1000 mg via INTRAVENOUS

## 2023-04-08 MED ORDER — TRANEXAMIC ACID-NACL 1000-0.7 MG/100ML-% IV SOLN
INTRAVENOUS | Status: AC
  Filled 2023-04-08: qty 100

## 2023-04-08 MED ORDER — ORAL CARE MOUTH RINSE: 15.0000 mL | OROMUCOSAL | Status: DC | PRN

## 2023-04-08 MED ORDER — CEFAZOLIN SODIUM-DEXTROSE 2-4 GM/100ML-% IV SOLN
2.0000 g | Freq: Four times a day (QID) | INTRAVENOUS | Status: AC
  Administered 2023-04-08 (×2): 2 g via INTRAVENOUS
  Filled 2023-04-08 (×3): qty 100

## 2023-04-08 MED ORDER — PANTOPRAZOLE SODIUM 40 MG PO TBEC
40.0000 mg | DELAYED_RELEASE_TABLET | Freq: Two times a day (BID) | ORAL | Status: DC
  Administered 2023-04-08: 40 mg via ORAL
  Filled 2023-04-08 (×2): qty 1

## 2023-04-08 MED ORDER — MENTHOL 3 MG MT LOZG: 1.0000 | LOZENGE | OROMUCOSAL | Status: DC | PRN

## 2023-04-08 MED ORDER — ORAL CARE MOUTH RINSE: 15.0000 mL | Freq: Once | OROMUCOSAL | Status: AC

## 2023-04-08 MED ORDER — SODIUM CHLORIDE (PF) 0.9 % IJ SOLN
INTRAMUSCULAR | Status: DC | PRN
  Administered 2023-04-08: 120 mL via INTRAMUSCULAR

## 2023-04-08 MED ORDER — SPIRONOLACTONE 25 MG PO TABS
25.0000 mg | ORAL_TABLET | Freq: Every day | ORAL | Status: DC
  Administered 2023-04-09: 25 mg via ORAL
  Filled 2023-04-08: qty 1

## 2023-04-08 MED ORDER — CHLORHEXIDINE GLUCONATE 4 % EX SOLN
60.0000 mL | Freq: Once | CUTANEOUS | Status: AC
  Administered 2023-04-08: 4 via TOPICAL

## 2023-04-08 MED ORDER — DAPAGLIFLOZIN PROPANEDIOL 10 MG PO TABS
10.0000 mg | ORAL_TABLET | Freq: Every day | ORAL | Status: DC
  Administered 2023-04-09: 10 mg via ORAL
  Filled 2023-04-08: qty 1

## 2023-04-08 MED ORDER — CELECOXIB 200 MG PO CAPS
ORAL_CAPSULE | ORAL | Status: AC
  Filled 2023-04-08: qty 2

## 2023-04-08 MED ORDER — LIDOCAINE HCL (PF) 2 % IJ SOLN
INTRAMUSCULAR | Status: AC
  Filled 2023-04-08: qty 5

## 2023-04-08 MED ORDER — GABAPENTIN 300 MG PO CAPS
300.0000 mg | ORAL_CAPSULE | Freq: Once | ORAL | Status: AC
  Administered 2023-04-08: 300 mg via ORAL

## 2023-04-08 MED ORDER — BUPIVACAINE HCL (PF) 0.5 % IJ SOLN
INTRAMUSCULAR | Status: DC | PRN
  Administered 2023-04-08: 3 mL

## 2023-04-08 MED ORDER — BUPIVACAINE LIPOSOME 1.3 % IJ SUSP
INTRAMUSCULAR | Status: AC
  Filled 2023-04-08: qty 40

## 2023-04-08 MED ORDER — PHENYLEPHRINE HCL-NACL 20-0.9 MG/250ML-% IV SOLN
INTRAVENOUS | Status: AC
  Filled 2023-04-08: qty 250

## 2023-04-08 MED ORDER — PROPOFOL 10 MG/ML IV BOLUS
INTRAVENOUS | Status: AC
  Filled 2023-04-08: qty 20

## 2023-04-08 MED ORDER — PROPOFOL 500 MG/50ML IV EMUL
INTRAVENOUS | Status: DC | PRN
  Administered 2023-04-08: 100 ug/kg/min via INTRAVENOUS

## 2023-04-08 MED ORDER — ACETAMINOPHEN 10 MG/ML IV SOLN
INTRAVENOUS | Status: DC | PRN
  Administered 2023-04-08: 1000 mg via INTRAVENOUS

## 2023-04-08 MED ORDER — ONDANSETRON HCL 4 MG/2ML IJ SOLN
INTRAMUSCULAR | Status: AC
  Filled 2023-04-08: qty 2

## 2023-04-08 MED ORDER — KETAMINE HCL 50 MG/5ML IJ SOSY
PREFILLED_SYRINGE | INTRAMUSCULAR | Status: AC
  Filled 2023-04-08: qty 5

## 2023-04-08 MED ORDER — OXYCODONE HCL 5 MG PO TABS: 10.0000 mg | ORAL_TABLET | ORAL | Status: DC | PRN

## 2023-04-08 MED ORDER — CELECOXIB 200 MG PO CAPS
200.0000 mg | ORAL_CAPSULE | Freq: Two times a day (BID) | ORAL | Status: DC
  Administered 2023-04-08 – 2023-04-09 (×2): 200 mg via ORAL
  Filled 2023-04-08 (×2): qty 1

## 2023-04-08 MED ORDER — KETAMINE HCL 50 MG/5ML IJ SOSY
PREFILLED_SYRINGE | INTRAMUSCULAR | Status: DC | PRN
  Administered 2023-04-08: 10 mg via INTRAVENOUS
  Administered 2023-04-08: 20 mg via INTRAVENOUS
  Administered 2023-04-08: 10 mg via INTRAVENOUS

## 2023-04-08 MED ORDER — COLESTIPOL HCL 1 G PO TABS
2.0000 g | ORAL_TABLET | Freq: Three times a day (TID) | ORAL | Status: DC
  Administered 2023-04-08 – 2023-04-09 (×2): 2 g via ORAL
  Filled 2023-04-08 (×4): qty 2

## 2023-04-08 MED ORDER — PHENOL 1.4 % MT LIQD: 1.0000 | OROMUCOSAL | Status: DC | PRN

## 2023-04-08 MED ORDER — ONDANSETRON HCL 4 MG/2ML IJ SOLN
INTRAMUSCULAR | Status: DC | PRN
  Administered 2023-04-08: 4 mg via INTRAVENOUS

## 2023-04-08 MED ORDER — DEXAMETHASONE SODIUM PHOSPHATE 10 MG/ML IJ SOLN
INTRAMUSCULAR | Status: AC
  Filled 2023-04-08: qty 1

## 2023-04-08 MED ORDER — ONDANSETRON HCL 4 MG PO TABS: 4.0000 mg | ORAL_TABLET | Freq: Four times a day (QID) | ORAL | Status: DC | PRN

## 2023-04-08 MED ORDER — ASPIRIN 325 MG PO TBEC
325.0000 mg | DELAYED_RELEASE_TABLET | Freq: Every day | ORAL | Status: DC
  Filled 2023-04-08: qty 1

## 2023-04-08 MED ORDER — ACETAMINOPHEN 10 MG/ML IV SOLN: 1000.0000 mg | Freq: Once | INTRAVENOUS | Status: DC | PRN

## 2023-04-08 MED ORDER — SENNOSIDES-DOCUSATE SODIUM 8.6-50 MG PO TABS
ORAL_TABLET | ORAL | Status: AC
  Filled 2023-04-08: qty 1

## 2023-04-08 MED ORDER — SENNOSIDES-DOCUSATE SODIUM 8.6-50 MG PO TABS
1.0000 | ORAL_TABLET | Freq: Two times a day (BID) | ORAL | Status: DC
  Administered 2023-04-08 – 2023-04-09 (×2): 1 via ORAL
  Filled 2023-04-08 (×2): qty 1

## 2023-04-08 MED ORDER — ONDANSETRON HCL 4 MG/2ML IJ SOLN
4.0000 mg | Freq: Four times a day (QID) | INTRAMUSCULAR | Status: DC | PRN
  Administered 2023-04-08: 4 mg via INTRAVENOUS
  Filled 2023-04-08: qty 2

## 2023-04-08 MED ORDER — BISACODYL 10 MG RE SUPP: 10.0000 mg | Freq: Every day | RECTAL | Status: DC | PRN

## 2023-04-08 MED ORDER — MIDAZOLAM HCL 2 MG/2ML IJ SOLN
INTRAMUSCULAR | Status: AC
  Filled 2023-04-08: qty 2

## 2023-04-08 MED ORDER — CHLORHEXIDINE GLUCONATE 0.12 % MT SOLN
OROMUCOSAL | Status: AC
  Filled 2023-04-08: qty 15

## 2023-04-08 MED ORDER — BUPIVACAINE HCL (PF) 0.25 % IJ SOLN
INTRAMUSCULAR | Status: AC
  Filled 2023-04-08: qty 120

## 2023-04-08 MED ORDER — DEXAMETHASONE SODIUM PHOSPHATE 10 MG/ML IJ SOLN
8.0000 mg | Freq: Once | INTRAMUSCULAR | Status: AC
  Administered 2023-04-08: 8 mg via INTRAVENOUS

## 2023-04-08 MED ORDER — DIPHENHYDRAMINE HCL 12.5 MG/5ML PO ELIX: 12.5000 mg | ORAL_SOLUTION | ORAL | Status: DC | PRN

## 2023-04-08 MED ORDER — OXYCODONE HCL 5 MG PO TABS
ORAL_TABLET | ORAL | Status: AC
  Filled 2023-04-08: qty 1

## 2023-04-08 MED ORDER — SODIUM CHLORIDE 0.9 % IV SOLN: INTRAVENOUS | Status: DC

## 2023-04-08 MED ORDER — OXYCODONE HCL 5 MG PO TABS: 5.0000 mg | ORAL_TABLET | ORAL | Status: DC | PRN

## 2023-04-08 MED ORDER — SODIUM CHLORIDE (PF) 0.9 % IJ SOLN
INTRAMUSCULAR | Status: AC
  Filled 2023-04-08: qty 80

## 2023-04-08 MED ORDER — FENTANYL CITRATE (PF) 100 MCG/2ML IJ SOLN
INTRAMUSCULAR | Status: AC
  Filled 2023-04-08: qty 2

## 2023-04-08 MED ORDER — ALUM & MAG HYDROXIDE-SIMETH 200-200-20 MG/5ML PO SUSP: 30.0000 mL | ORAL | Status: DC | PRN

## 2023-04-08 MED ORDER — GABAPENTIN 300 MG PO CAPS
ORAL_CAPSULE | ORAL | Status: AC
  Filled 2023-04-08: qty 1

## 2023-04-08 MED ORDER — METOCLOPRAMIDE HCL 10 MG PO TABS
10.0000 mg | ORAL_TABLET | Freq: Three times a day (TID) | ORAL | Status: DC
  Administered 2023-04-08 – 2023-04-09 (×3): 10 mg via ORAL
  Filled 2023-04-08 (×4): qty 1

## 2023-04-08 MED ORDER — MAGNESIUM HYDROXIDE 400 MG/5ML PO SUSP
ORAL | Status: AC
  Filled 2023-04-08: qty 30

## 2023-04-08 MED ORDER — LEVOTHYROXINE SODIUM 112 MCG PO TABS
112.0000 ug | ORAL_TABLET | Freq: Every day | ORAL | Status: DC
  Administered 2023-04-09: 112 ug via ORAL

## 2023-04-08 MED ORDER — HYDROMORPHONE HCL 1 MG/ML IJ SOLN: 0.5000 mg | INTRAMUSCULAR | Status: DC | PRN

## 2023-04-08 MED ORDER — LACTATED RINGERS IV SOLN: INTRAVENOUS | Status: DC

## 2023-04-08 SURGICAL SUPPLY — 66 items
ATTUNE MED DOME PAT 32 KNEE (Knees) IMPLANT
ATTUNE PSFEM LTSZ5 NARCEM KNEE (Femur) IMPLANT
ATTUNE PSRP INSE SZ5 7 KNEE (Insert) IMPLANT
BASEPLATE TIBIAL ROTATING SZ 4 (Knees) IMPLANT
BATTERY INSTRU NAVIGATION (MISCELLANEOUS) ×4 IMPLANT
BIT DRILL QUICK REL 1/8 2PK SL (BIT) ×1 IMPLANT
BLADE CLIPPER SURG (BLADE) IMPLANT
BLADE SAW 70X12.5 (BLADE) ×1 IMPLANT
BLADE SAW 90X13X1.19 OSCILLAT (BLADE) ×1 IMPLANT
BLADE SAW 90X25X1.19 OSCILLAT (BLADE) ×1 IMPLANT
BONE CEMENT GENTAMICIN (Cement) ×2 IMPLANT
BRUSH SCRUB EZ PLAIN DRY (MISCELLANEOUS) ×1 IMPLANT
CEMENT BONE GENTAMICIN 40 (Cement) IMPLANT
COOLER POLAR GLACIER W/PUMP (MISCELLANEOUS) ×1 IMPLANT
CUFF TRNQT CYL 24X4X16.5-23 (TOURNIQUET CUFF) IMPLANT
CUFF TRNQT CYL 30X4X21-28X (TOURNIQUET CUFF) IMPLANT
DRAPE SHEET LG 3/4 BI-LAMINATE (DRAPES) ×1 IMPLANT
DRSG AQUACEL AG ADV 3.5X14 (GAUZE/BANDAGES/DRESSINGS) ×1 IMPLANT
DRSG MEPILEX SACRM 8.7X9.8 (GAUZE/BANDAGES/DRESSINGS) ×1 IMPLANT
DRSG TEGADERM 4X4.75 (GAUZE/BANDAGES/DRESSINGS) ×1 IMPLANT
DURAPREP 26ML APPLICATOR (WOUND CARE) ×2 IMPLANT
ELECT CAUTERY BLADE 6.4 (BLADE) ×1 IMPLANT
ELECT REM PT RETURN 9FT ADLT (ELECTROSURGICAL) ×1 IMPLANT
ELECTRODE REM PT RTRN 9FT ADLT (ELECTROSURGICAL) ×1 IMPLANT
EVACUATOR 1/8 PVC DRAIN (DRAIN) ×1 IMPLANT
EX-PIN ORTHOLOCK NAV 4X150 (PIN) ×2 IMPLANT
GAUZE XEROFORM 1X8 LF (GAUZE/BANDAGES/DRESSINGS) ×1 IMPLANT
GLOVE BIOGEL M STRL SZ7.5 (GLOVE) ×6 IMPLANT
GLOVE SURG UNDER POLY LF SZ8 (GLOVE) ×2 IMPLANT
GOWN STRL REUS W/ TWL LRG LVL3 (GOWN DISPOSABLE) ×1 IMPLANT
GOWN STRL REUS W/ TWL XL LVL3 (GOWN DISPOSABLE) ×1 IMPLANT
GOWN TOGA ZIPPER T7+ PEEL AWAY (MISCELLANEOUS) ×1 IMPLANT
HOLDER FOLEY CATH W/STRAP (MISCELLANEOUS) ×1 IMPLANT
HOOD PEEL AWAY T7 (MISCELLANEOUS) ×1 IMPLANT
KIT TURNOVER KIT A (KITS) ×1 IMPLANT
KNIFE SCULPS 14X20 (INSTRUMENTS) ×1 IMPLANT
MANIFOLD NEPTUNE II (INSTRUMENTS) ×2 IMPLANT
NDL SPNL 20GX3.5 QUINCKE YW (NEEDLE) ×2 IMPLANT
NEEDLE SPNL 20GX3.5 QUINCKE YW (NEEDLE) ×2 IMPLANT
PACK TOTAL KNEE (MISCELLANEOUS) ×1 IMPLANT
PAD ABD DERMACEA PRESS 5X9 (GAUZE/BANDAGES/DRESSINGS) ×2 IMPLANT
PAD ARMBOARD POSITIONER FOAM (MISCELLANEOUS) ×3 IMPLANT
PAD WRAPON POLAR KNEE (MISCELLANEOUS) ×1 IMPLANT
PENCIL SMOKE EVACUATOR COATED (MISCELLANEOUS) ×1 IMPLANT
PIN DRILL FIX HALF THREAD (BIT) ×2 IMPLANT
PIN FIXATION 1/8DIA X 3INL (PIN) ×1 IMPLANT
PULSAVAC PLUS IRRIG FAN TIP (DISPOSABLE) ×1 IMPLANT
SOL .9 NS 3000ML IRR UROMATIC (IV SOLUTION) ×1 IMPLANT
SOLUTION IRRIG SURGIPHOR (IV SOLUTION) ×1 IMPLANT
SPONGE DRAIN TRACH 4X4 STRL 2S (GAUZE/BANDAGES/DRESSINGS) ×1 IMPLANT
STAPLER SKIN PROX 35W (STAPLE) ×1 IMPLANT
STOCKINETTE IMPERV 14X48 (MISCELLANEOUS) ×1 IMPLANT
STOCKINETTE STRL BIAS CUT 8X4 (MISCELLANEOUS) ×1 IMPLANT
STRAP TIBIA SHORT (MISCELLANEOUS) ×1 IMPLANT
SUCTION TUBE FRAZIER 10FR DISP (SUCTIONS) ×1 IMPLANT
SUT VIC AB 0 CT1 36 (SUTURE) ×1 IMPLANT
SUT VIC AB 1 CT1 36 (SUTURE) ×2 IMPLANT
SUT VIC AB 2-0 CT2 27 (SUTURE) ×1 IMPLANT
SYR 30ML LL (SYRINGE) ×2 IMPLANT
TIP FAN IRRIG PULSAVAC PLUS (DISPOSABLE) ×1 IMPLANT
TOWEL OR 17X26 4PK STRL BLUE (TOWEL DISPOSABLE) ×1 IMPLANT
TOWER CARTRIDGE SMART MIX (DISPOSABLE) ×1 IMPLANT
TRAP FLUID SMOKE EVACUATOR (MISCELLANEOUS) ×1 IMPLANT
TRAY FOLEY MTR SLVR 16FR STAT (SET/KITS/TRAYS/PACK) ×1 IMPLANT
WATER STERILE IRR 1000ML POUR (IV SOLUTION) ×1 IMPLANT
WRAPON POLAR PAD KNEE (MISCELLANEOUS) ×1 IMPLANT

## 2023-04-08 NOTE — Evaluation (Signed)
 Physical Therapy Evaluation Patient Details Name: Helen Garcia MRN: [id number] DOB: 25-Apr-1960 Today's Date: 04/08/2023  History of Present Illness  Patient is s/p L TKA.  Clinical Impression  Patient received in supine. She is agreeable to PT assessment. Patient reports pins and needles in B LEs. Good strength in R LE. Patient required cga for supine to sit. Cga for sit to stand and was able to take a few steps to recliner with RW and cga. Patient will continue to benefit from skilled PT to improve mobility, and safety.             If plan is discharge home, recommend the following: A little help with walking and/or transfers;A little help with bathing/dressing/bathroom;Assist for transportation;Help with stairs or ramp for entrance   Can travel by private vehicle    yes    Equipment Recommendations Rolling walker (2 wheels)  Recommendations for Other Services       Functional Status Assessment Patient has had a recent decline in their functional status and demonstrates the ability to make significant improvements in function in a reasonable and predictable amount of time.     Precautions / Restrictions Precautions Precautions: Fall Recall of Precautions/Restrictions: Intact Restrictions Weight Bearing Restrictions Per Provider Order: Yes LLE Weight Bearing Per Provider Order: Weight bearing as tolerated      Mobility  Bed Mobility Overal bed mobility: Needs Assistance Bed Mobility: Supine to Sit     Supine to sit: Contact guard          Transfers Overall transfer level: Needs assistance Equipment used: Rolling walker (2 wheels) Transfers: Sit to/from Stand, Bed to chair/wheelchair/BSC Sit to Stand: Contact guard assist   Step pivot transfers: Contact guard assist       General transfer comment: took a few steps from bed to recliner    Ambulation/Gait                  Stairs            Wheelchair Mobility     Tilt Bed    Modified  Rankin (Stroke Patients Only)       Balance Overall balance assessment: Needs assistance Sitting-balance support: Feet supported Sitting balance-Leahy Scale: Good     Standing balance support: Bilateral upper extremity supported, During functional activity, Reliant on assistive device for balance Standing balance-Leahy Scale: Fair                               Pertinent Vitals/Pain Pain Assessment Pain Assessment: Faces Faces Pain Scale: Hurts even more Pain Location: L knee Pain Descriptors / Indicators: Discomfort, Grimacing, Operative site guarding Pain Intervention(s): Monitored during session, Premedicated before session, Repositioned, Ice applied    Home Living Family/patient expects to be discharged to:: Private residence Living Arrangements: Alone Available Help at Discharge: Friend(s);Available PRN/intermittently (friend will stay with her first 4 days) Type of Home: House Home Access: Stairs to enter   Entrance Stairs-Number of Steps: 4 Alternate Level Stairs-Number of Steps: 7 up and 7 down Home Layout: Multi-level Home Equipment: Crutches;BSC/3in1      Prior Function Prior Level of Function : Independent/Modified Independent;Driving;Working/employed             Mobility Comments: independent. Works at Programme researcher, broadcasting/film/video part time ADLs Comments: independent.     Extremity/Trunk Assessment   Upper Extremity Assessment Upper Extremity Assessment: Defer to OT evaluation    Lower Extremity Assessment Lower Extremity Assessment:  LLE deficits/detail LLE: Unable to fully assess due to pain LLE Coordination: decreased gross motor    Cervical / Trunk Assessment Cervical / Trunk Assessment: Normal  Communication   Communication Communication: No apparent difficulties    Cognition Arousal: Alert Behavior During Therapy: WFL for tasks assessed/performed   PT - Cognitive impairments: No apparent impairments                          Following commands: Intact       Cueing Cueing Techniques: Verbal cues     General Comments      Exercises Total Joint Exercises Ankle Circles/Pumps: AROM, Both, 10 reps   Assessment/Plan    PT Assessment Patient needs continued PT services  PT Problem List Decreased strength;Decreased range of motion;Decreased activity tolerance;Decreased balance;Decreased mobility;Decreased safety awareness;Obesity;Pain       PT Treatment Interventions DME instruction;Gait training;Stair training;Functional mobility training;Therapeutic activities;Therapeutic exercise;Balance training;Neuromuscular re-education;Patient/family education    PT Goals (Current goals can be found in the Care Plan section)  Acute Rehab PT Goals Patient Stated Goal: return home, feel better PT Goal Formulation: With patient Time For Goal Achievement: 04/15/23 Potential to Achieve Goals: Good    Frequency BID     Co-evaluation               AM-PAC PT "6 Clicks" Mobility  Outcome Measure Help needed turning from your back to your side while in a flat bed without using bedrails?: A Little Help needed moving from lying on your back to sitting on the side of a flat bed without using bedrails?: A Little Help needed moving to and from a bed to a chair (including a wheelchair)?: A Little Help needed standing up from a chair using your arms (e.g., wheelchair or bedside chair)?: A Little Help needed to walk in hospital room?: A Lot Help needed climbing 3-5 steps with a railing? : A Lot 6 Click Score: 16    End of Session Equipment Utilized During Treatment: Gait belt;Oxygen Activity Tolerance: Patient tolerated treatment well Patient left: in chair;with call bell/phone within reach Nurse Communication: Mobility status PT Visit Diagnosis: Other abnormalities of gait and mobility (R26.89);Muscle weakness (generalized) (M62.81);Difficulty in walking, not elsewhere classified (R26.2);Pain Pain - Right/Left:  Left Pain - part of body: Knee    Time: 1610-9604 PT Time Calculation (min) (ACUTE ONLY): 15 min   Charges:   PT Evaluation $PT Eval Moderate Complexity: 1 Mod   PT General Charges $$ ACUTE PT VISIT: 1 Visit         Lashina Milles, PT, GCS 04/08/23,3:36 PM

## 2023-04-08 NOTE — Discharge Summary (Incomplete)
 Physician Discharge Summary  Subjective: * Day of Surgery * Procedure(s) (LRB): ARTHROPLASTY, KNEE, TOTAL, USING IMAGELESS COMPUTER-ASSISTED NAVIGATION (Left) Patient reports pain as mild.   Patient seen in rounds with Dr. Ernest Pine. Patient is well, and has had no acute complaints or problems. Denies any CP, N/V, SOB, fevers or chills We will start therapy today.  Patient is ready to go home  Physician Discharge Summary  Patient ID: Helen Garcia MRN: [id number] DOB/AGE: [birth year] 63 y.o.  Admit date: 04/08/2023 Discharge date: 04/08/2023  Admission Diagnoses:  Discharge Diagnoses:  Principal Problem:   History of total knee arthroplasty, left   Discharged Condition: good  Hospital Course: Patient presented to the hospital on 04/08/2023 for an elective left total knee arthroplasty. Patient was given 1g of TXA and 2g of Ancef prior to the procedure. she tolerated the procedure well without any complications. See procedural note below for details. Postoperatively, the patient did very well. she was able to pass PT protocols on post-op day one without any issues. JP drain was removed without any difficulty and was intact. she was able to void her bladder without any difficulty. Physical exam was unremarkable. she denies any SOB, CP, N/V, fevers or chills. Vital signs are stable. Patient is stable to discharge home.   PROCEDURE:  Left total knee arthroplasty using computer-assisted navigation   SURGEON:  Jena Gauss. M.D.   ASSISTANT:  Gean Birchwood, PA-C (present and scrubbed throughout the case, critical for assistance with exposure, retraction, instrumentation, and closure)   ANESTHESIA: spinal   ESTIMATED BLOOD LOSS: 50 mL   FLUIDS REPLACED: 600 mL of crystalloid   TOURNIQUET TIME: 86 minutes   DRAINS: 2 medium Hemovac drains   SOFT TISSUE RELEASES: Anterior cruciate ligament, posterior cruciate ligament, deep medial collateral ligament, patellofemoral ligament    IMPLANTS UTILIZED: DePuy Attune size 5N posterior stabilized femoral component (cemented), size 4 rotating platform tibial component (cemented), 32 mm medialized dome patella (cemented), and a 7 mm stabilized rotating platform polyethylene insert.  Treatments: none  Discharge Exam: Blood pressure 120/71, pulse 91, temperature (!) 97 F (36.1 C), resp. rate 15, height 5\' 5"  (1.651 m), weight 111.1 kg, SpO2 93%.   Disposition: home   Allergies as of 04/08/2023       Reactions   Abatacept Hives, Itching   Codeine Nausea And Vomiting, Other (See Comments)   GI upset   Tomato Rash   Upset stomach     Med Rec must be completed prior to using this [facility]***        Durable Medical Equipment  (From admission, onward)           Start     Ordered   04/08/23 1153  DME Walker rolling  Once       Question:  Patient needs a walker to treat with the following condition  Answer:  Total knee replacement status   04/08/23 1152   04/08/23 1153  DME Bedside commode  Once       Comments: Patient is not able to walk the distance required to go the bathroom, or he/she is unable to safely negotiate stairs required to access the bathroom.  A 3in1 BSC will alleviate this problem  Question:  Patient needs a bedside commode to treat with the following condition  Answer:  Total knee replacement status   04/08/23 1152            Follow-up Information     Rayburn Go, PA-C Follow up on 04/23/2023.  Specialty: Orthopedic Surgery Why: at 9:45am Contact information: [address] [place] [address] [phone]         Donato Heinz, MD Follow up on 05/21/2023.   Specialty: Orthopedic Surgery Why: at 2:30pm Contact information: [address] [facility] [place] [address] [phone]                 Signed: Gean Birchwood 04/08/2023, 12:30 PM   Objective: Vital signs in last 24 hours: Temp:  [96.7 F (35.9 C)-97 F (36.1 C)]  97 F (36.1 C) (04/02 1203) Pulse Rate:  [87-96] 91 (04/02 1215) Resp:  [15-20] 15 (04/02 1215) BP: (120-132)/(69-79) 120/71 (04/02 1215) SpO2:  [93 %-98 %] 93 % (04/02 1215) Weight:  [111.1 kg] 111.1 kg (04/02 0729)  Intake/Output from previous day:  Intake/Output Summary (Last 24 hours) at 04/08/2023 1230 Last data filed at 04/08/2023 1132 Gross per 24 hour  Intake 900 ml  Output 900 ml  Net 0 ml    Intake/Output this shift: Total I/O In: 900 [I.V.:600; IV Piggyback:300] Out: 900 [Urine:850; Blood:50]  Labs: No results for input(s): "HGB" in the last 72 hours. No results for input(s): "WBC", "RBC", "HCT", "PLT" in the last 72 hours. No results for input(s): "NA", "K", "CL", "CO2", "BUN", "CREATININE", "GLUCOSE", "CALCIUM" in the last 72 hours. No results for input(s): "LABPT", "INR" in the last 72 hours.  EXAM: General - Patient is Alert, Appropriate, and Oriented Extremity - Neurologically intact Neurovascular intact Sensation intact distally Intact pulses distally Dorsiflexion/Plantar flexion intact No cellulitis present Compartment soft Dressing - dressing C/D/I and no drainage Motor Function - intact, moving foot and toes well on exam.  JP Drain pulled without difficulty. Intact  Assessment/Plan: * Day of Surgery * Procedure(s) (LRB): ARTHROPLASTY, KNEE, TOTAL, USING IMAGELESS COMPUTER-ASSISTED NAVIGATION (Left) Procedure(s) (LRB): ARTHROPLASTY, KNEE, TOTAL, USING IMAGELESS COMPUTER-ASSISTED NAVIGATION (Left) Past Medical History:  Diagnosis Date   Age-related osteoporosis without current pathological fracture    Aortic atherosclerosis (HCC)    Cavernous hemangioma of liver 2017   Cervical spondylosis    Chronic heart failure with preserved ejection fraction (HCC)    Degenerative arthritis of left knee    Depression    DOE (dyspnea on exertion)    Erythrocytosis    GERD (gastroesophageal reflux disease)    Glaucoma    Hepatic steatosis    History of  hiatal hernia    HLD (hyperlipidemia)    HTN (hypertension)    Hypothyroidism    IBS (irritable bowel syndrome)    Insomnia    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   Weight as of this encounter: 111.1 kg.  Patient will continue to work with physical therapy   Discussed with the patient continuing to utilize Polar Care   Patient will use  bone foam in 20-30 minute intervals   Patient will wear TED hose bilaterally to help prevent DVT and clot formation   Discussed the Aquacel bandage.  This bandage will stay in place 7 days postoperatively.  Can be replaced with honeycomb bandages that will be sent home with the patient   Discussed sending the patient home with tramadol and oxycodone for as needed pain management.  Patient will also be sent home with Celebrex to help with swelling and inflammation.  Patient will take an 81 mg aspirin twice daily for DVT prophylaxis   JP drain removed without difficulty, intact   Weight-Bearing as tolerated to left leg   Patient will follow-up with [facility] in 2 weeks for staple removal and reevaluation  Diet - Regular diet Follow up - in 2 weeks Activity - WBAT Disposition - Home Condition Upon Discharge - Good DVT Prophylaxis - Aspirin and TED hose  Danise Edge, PA-C Orthopaedic Surgery 04/08/2023, 12:30 PM

## 2023-04-08 NOTE — Transfer of Care (Signed)
 Immediate Anesthesia Transfer of Care Note  Patient: Helen Garcia  Procedure(s) Performed: ARTHROPLASTY, KNEE, TOTAL, USING IMAGELESS COMPUTER-ASSISTED NAVIGATION (Left: Knee)  Patient Location: PACU  Anesthesia Type:MAC and Spinal  Level of Consciousness: awake, drowsy, and patient cooperative  Airway & Oxygen Therapy: Patient Spontanous Breathing and Patient connected to face mask  Post-op Assessment: Report given to RN and Post -op Vital signs reviewed and stable  Post vital signs: Reviewed and stable  Last Vitals:  Vitals Value Taken Time  BP 132/79 04/08/23 1203  Temp 36.1 C 04/08/23 1203  Pulse 93 04/08/23 1205  Resp 18 04/08/23 1205  SpO2 98 % 04/08/23 1205  Vitals shown include unfiled device data.  Last Pain:  Vitals:   04/08/23 0729  TempSrc: Tympanic  PainSc: 5          Complications: No notable events documented.

## 2023-04-08 NOTE — Progress Notes (Signed)
 Patient is not able to walk the distance required to go the bathroom, or he/she is unable to safely negotiate stairs required to access the bathroom.  A 3in1 BSC will alleviate this problem   Amenda Duclos P. Angie Fava M.D.

## 2023-04-08 NOTE — Anesthesia Preprocedure Evaluation (Signed)
 Anesthesia Evaluation  Patient identified by MRN, date of birth, ID band Patient awake    Reviewed: Allergy & Precautions, H&P , NPO status , Patient's Chart, lab work & pertinent test results, reviewed documented beta blocker date and time   History of Anesthesia Complications (+) PONV and history of anesthetic complications  Airway Mallampati: II   Neck ROM: full    Dental  (+) Poor Dentition   Pulmonary sleep apnea , former smoker   Pulmonary exam normal        Cardiovascular Exercise Tolerance: Poor hypertension, On Medications (-) angina + DOE  (-) CHF Normal cardiovascular exam Rhythm:regular Rate:Normal     Neuro/Psych  PSYCHIATRIC DISORDERS  Depression    negative neurological ROS     GI/Hepatic Neg liver ROS, hiatal hernia,GERD  Medicated,,  Endo/Other  Hypothyroidism    Renal/GU negative Renal ROS  negative genitourinary   Musculoskeletal   Abdominal   Peds  Hematology negative hematology ROS (+)   Anesthesia Other Findings Past Medical History: No date: Age-related osteoporosis without current pathological  fracture No date: Aortic atherosclerosis (HCC) 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 No date: Cervical spondylosis No date: Chronic heart failure with preserved ejection fraction (HCC) No date: Degenerative arthritis of left knee No date: Depression No date: DOE (dyspnea on exertion) No date: Erythrocytosis No date: GERD (gastroesophageal reflux disease) No date: Glaucoma No date: Hepatic steatosis No date: History of hiatal hernia No date: HLD (hyperlipidemia) No date: HTN (hypertension) No date: Hypothyroidism No date: IBS (irritable bowel syndrome) No date: Insomnia No date: 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) No date: Long term (current) use of immunosuppressive biologic     Comment:  a.) secukinumab for psoriatic arthritis No date: Morbid obesity with BMI of 40.0-44.9, adult  (HCC) No date: OSA on CPAP No date: Osteoarthritis No date: Plaque psoriasis No date: Polycythemia, secondary No date: PONV (postoperative nausea and vomiting) No date: Psoriatic arthritis (HCC)     Comment:  a.) Tx'd with secukinumab No date: Tenosynovitis of fingers No date: Uveitis No date: Vasovagal episode Past Surgical History: No date: ABDOMINAL HYSTERECTOMY 08/17/2020: ANKLE ARTHROSCOPY; Right     Comment:  Procedure: ANKLE ARTHROSCOPY- OCD repair; debridement,               extensive;  Surgeon: Gwyneth Revels, DPM;  Location: [facility]              ORS;  Service: Podiatry;  Laterality: Right; 08/17/2020: ANKLE RECONSTRUCTION; Right     Comment:  Procedure: RECONSTRUCTION ANKLE- Brostrum-Gould;                Surgeon: Gwyneth Revels, DPM;  Location: [facility] ORS;                Service: Podiatry;  Laterality: Right; No date: APPENDECTOMY No date: CHOLECYSTECTOMY 1974: EYE SURGERY; Left     Comment:  eye muscle 07/23/2022: HAMMER TOE SURGERY; Left     Comment:  Procedure: HAMMER TOE CORRECTION - SECOND AND THIRD;                Surgeon: Gwyneth Revels, DPM;  Location: [facility]               [facility];  Service: Podiatry;  Laterality: Left; 03/12/2017: KNEE ARTHROSCOPY WITH MEDIAL MENISECTOMY; Left     Comment:  Procedure: KNEE ARTHROSCOPY WITH MEDIAL MENISECTOMY;                Surgeon: Kennedy Bucker, MD;  Location: [facility] ORS;  Service: Orthopedics;  Laterality: Left; 08/17/2020: TENDON REPAIR; Right     Comment:  Procedure: TENDON REPAIR- Flexor tendon repair-second;               Tenolysis, multiple;  Surgeon: Gwyneth Revels, DPM;                Location: [facility] ORS;  Service: Podiatry;  Laterality:               Right; No date: TONSILLECTOMY BMI    Body Mass Index: 40.77 kg/m     Reproductive/Obstetrics negative OB ROS                             Anesthesia Physical Anesthesia Plan  ASA: 3  Anesthesia Plan: Spinal   Post-op Pain  Management: Ketamine IV* and Regional block*   Induction:   PONV Risk Score and Plan: 4 or greater  Airway Management Planned:   Additional Equipment:   Intra-op Plan:   Post-operative Plan:   Informed Consent: I have reviewed the patients History and Physical, chart, labs and discussed the procedure including the risks, benefits and alternatives for the proposed anesthesia with the patient or authorized representative who has indicated his/her understanding and acceptance.     Dental Advisory Given  Plan Discussed with: CRNA  Anesthesia Plan Comments:        Anesthesia Quick Evaluation

## 2023-04-08 NOTE — Progress Notes (Incomplete)
 Subjective: 1 Day Post-Op Procedure(s) (LRB): ARTHROPLASTY, KNEE, TOTAL, USING IMAGELESS COMPUTER-ASSISTED NAVIGATION (Left) Patient reports pain as mild.   Patient seen in rounds with Dr. Ernest Pine. Patient is well, and has had no acute complaints or problems. Denies any CP, SOB, fevers or chills. Does admit some nausea, one episode of emesis and passed out while on the commode with nursing last night, feeling better since. We will continue therapy today.  Plan is to go Home after hospital stay.  Objective: Vital signs in last 24 hours: Temp:  [97 F (36.1 C)-98.6 F (37 C)] 98 F (36.7 C) (04/03 0732) Pulse Rate:  [62-96] 67 (04/03 0732) Resp:  [15-20] 16 (04/03 0732) BP: (115-143)/(57-80) 143/57 (04/03 0732) SpO2:  [93 %-98 %] 98 % (04/03 0732)  Intake/Output from previous day:  Intake/Output Summary (Last 24 hours) at 04/09/2023 0817 Last data filed at 04/09/2023 0300 Gross per 24 hour  Intake 2072.42 ml  Output 1300 ml  Net 772.42 ml    Intake/Output this shift: No intake/output data recorded.  Labs: No results for input(s): "HGB" in the last 72 hours. No results for input(s): "WBC", "RBC", "HCT", "PLT" in the last 72 hours. No results for input(s): "NA", "K", "CL", "CO2", "BUN", "CREATININE", "GLUCOSE", "CALCIUM" in the last 72 hours. No results for input(s): "LABPT", "INR" in the last 72 hours.  EXAM General - Patient is Alert, Appropriate, and Oriented Extremity - Neurologically intact Neurovascular intact Sensation intact distally Intact pulses distally Dorsiflexion/Plantar flexion intact No cellulitis present Compartment soft Dressing - dressing C/D/I and no drainage Motor Function - intact, moving foot and toes well on exam.  JP Drain pulled without difficulty. 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)    Long term (current) use of immunosuppressive biologic    a.) secukinumab for psoriatic arthritis   Morbid obesity with BMI of 40.0-44.9, adult (HCC)    OSA on CPAP    Osteoarthritis    Plaque psoriasis    Polycythemia, secondary    PONV (postoperative nausea and vomiting)    Psoriatic arthritis (HCC)    a.) Tx'd with secukinumab   Tenosynovitis of fingers    Uveitis    Vasovagal episode     Assessment/Plan: 1 Day Post-Op Procedure(s) (LRB): ARTHROPLASTY, KNEE, TOTAL, USING IMAGELESS COMPUTER-ASSISTED NAVIGATION (Left) Principal Problem:   History of total knee arthroplasty, left  Estimated body mass index is 40.77 kg/m as calculated from the following:   Height as of this encounter: 5\' 5"  (1.651 m).   Weight as of this encounter: 111.1 kg. Advance diet Up with therapy  Patient will continue to work with physical therapy to pass postoperative PT protocols, ROM and strengthening  Discussed with the patient continuing to utilize Polar Care  Patient will use bone foam in 20-30 minute intervals  Patient will wear  TED hose bilaterally to help prevent DVT and clot formation  Discussed the Aquacel bandage.  This bandage will stay in place 7 days postoperatively.  Can be replaced with honeycomb bandages that will be sent home with the patient  Discussed sending the patient home with tramadol and continue with at home percocet for as needed pain management.  Patient will also be sent home with Celebrex to help with swelling and inflammation.   Patient will take an 81 mg aspirin twice daily for DVT prophylaxis  JP drain removed without difficulty, intact  Weight-Bearing as tolerated to left leg  Patient will follow-up with [facility] in 2 weeks for staple removal and reevaluation  Rayburn Go, PA-C [facility] 04/09/2023, 8:17 AM

## 2023-04-08 NOTE — Op Note (Signed)
 OPERATIVE NOTE  DATE OF SURGERY:  04/08/2023  PATIENT NAME:  Helen Garcia   DOB: 12-09-1960  MRN: [id number]  PRE-OPERATIVE DIAGNOSIS: Degenerative arthrosis of the left knee, primary  POST-OPERATIVE DIAGNOSIS:  Same  PROCEDURE:  Left total knee arthroplasty using computer-assisted navigation  SURGEON:  Jena Gauss. M.D.  ASSISTANT:  Gean Birchwood, PA-C (present and scrubbed throughout the case, critical for assistance with exposure, retraction, instrumentation, and closure)  ANESTHESIA: spinal  ESTIMATED BLOOD LOSS: 50 mL  FLUIDS REPLACED: 600 mL of crystalloid  TOURNIQUET TIME: 86 minutes  DRAINS: 2 medium Hemovac drains  SOFT TISSUE RELEASES: Anterior cruciate ligament, posterior cruciate ligament, deep medial collateral ligament, patellofemoral ligament  IMPLANTS UTILIZED: DePuy Attune size 5N posterior stabilized femoral component (cemented), size 4 rotating platform tibial component (cemented), 32 mm medialized dome patella (cemented), and a 7 mm stabilized rotating platform polyethylene insert.  INDICATIONS FOR SURGERY: Helen Garcia is a 63 y.o. year old female with a long history of progressive knee pain. X-rays demonstrated severe degenerative changes in tricompartmental fashion. The patient had not seen any significant improvement despite conservative nonsurgical intervention. After discussion of the risks and benefits of surgical intervention, the patient expressed understanding of the risks benefits and agree with plans for total knee arthroplasty.   The risks, benefits, and alternatives were discussed at length including but not limited to the risks of infection, bleeding, nerve injury, stiffness, blood clots, the need for revision surgery, cardiopulmonary complications, among others, and they were willing to proceed.  PROCEDURE IN DETAIL: The patient was brought into the operating room and, after adequate spinal anesthesia was achieved, a tourniquet  was placed on the patient's upper thigh. The patient's knee and leg were cleaned and prepped with alcohol and DuraPrep and draped in the usual sterile fashion. A "timeout" was performed as per usual protocol. The lower extremity was exsanguinated using an Esmarch, and the tourniquet was inflated to 300 mmHg. An anterior longitudinal incision was made followed by a standard mid vastus approach. The deep fibers of the medial collateral ligament were elevated in a subperiosteal fashion off of the medial flare of the tibia so as to maintain a continuous soft tissue sleeve. The patella was subluxed laterally and the patellofemoral ligament was incised. Inspection of the knee demonstrated severe degenerative changes with full-thickness loss of articular cartilage. Osteophytes were debrided using a rongeur. Anterior and posterior cruciate ligaments were excised. Two 4.0 mm Schanz pins were inserted in the femur and into the tibia for attachment of the array of trackers used for computer-assisted navigation. Hip center was identified using a circumduction technique. Distal landmarks were mapped using the computer. The distal femur and proximal tibia were mapped using the computer. The distal femoral cutting guide was positioned using computer-assisted navigation so as to achieve a 5 distal valgus cut. The femur was sized and it was felt that a size 5N femoral component was appropriate. A size 5 femoral cutting guide was positioned and the anterior cut was performed and verified using the computer. This was followed by completion of the posterior and chamfer cuts. Femoral cutting guide for the central box was then positioned in the center box cut was performed.  Attention was then directed to the proximal tibia. Medial and lateral menisci were excised. The extramedullary tibial cutting guide was positioned using computer-assisted navigation so as to achieve a 0 varus-valgus alignment and 3 posterior slope. The cut was  performed and verified using the computer. The proximal tibia was sized  and it was felt that a size 4 tibial tray was appropriate. Tibial and femoral trials were inserted followed by insertion of a 7 mm polyethylene insert. This allowed for excellent mediolateral soft tissue balancing both in flexion and in full extension. Finally, the patella was cut and prepared so as to accommodate a 32 mm medialized dome patella. A patella trial was placed and the knee was placed through a range of motion with excellent patellar tracking appreciated. The femoral trial was removed after debridement of posterior osteophytes. The central post-hole for the tibial component was reamed followed by insertion of a keel punch. Tibial trials were then removed. Cut surfaces of bone were irrigated with copious amounts of normal saline using pulsatile lavage and then suctioned dry. Polymethylmethacrylate cement with gentamicin was prepared in the usual fashion using a vacuum mixer. Cement was applied to the cut surface of the proximal tibia as well as along the undersurface of a size 4 rotating platform tibial component. Tibial component was positioned and impacted into place. Excess cement was removed using Personal assistant. Cement was then applied to the cut surfaces of the femur as well as along the posterior flanges of the size 5N femoral component. The femoral component was positioned and impacted into place. Excess cement was removed using Personal assistant. A 7 mm polyethylene trial was inserted and the knee was brought into full extension with steady axial compression applied. Finally, cement was applied to the backside of a 32 mm medialized dome patella and the patellar component was positioned and patellar clamp applied. Excess cement was removed using Personal assistant. After adequate curing of the cement, the tourniquet was deflated after a total tourniquet time of 86 minutes. Hemostasis was achieved using electrocautery. The knee was  irrigated with copious amounts of normal saline using pulsatile lavage followed by 450 ml of Surgiphor and then suctioned dry. 20 mL of 1.3% Exparel and 60 mL of 0.25% Marcaine in 40 mL of normal saline was injected along the posterior capsule, medial and lateral gutters, and along the arthrotomy site. A 7 mm stabilized rotating platform polyethylene insert was inserted and the knee was placed through a range of motion with excellent mediolateral soft tissue balancing appreciated and excellent patellar tracking noted. 2 medium drains were placed in the wound bed and brought out through separate stab incisions. The medial parapatellar portion of the incision was reapproximated using interrupted sutures of #1 Vicryl. Subcutaneous tissue was approximated in layers using first #0 Vicryl followed #2-0 Vicryl. The skin was approximated with skin staples. A sterile dressing was applied.  The patient tolerated the procedure well and was transported to the recovery room in stable condition.    Kerry Odonohue P. Angie Fava., M.D.

## 2023-04-08 NOTE — Anesthesia Procedure Notes (Signed)
 Spinal  Patient location during procedure: OR Start time: 04/08/2023 8:30 AM End time: 04/08/2023 8:31 AM Reason for block: surgical anesthesia Staffing Performed: resident/CRNA  Anesthesiologist: Yevette Edwards, MD Resident/CRNA: Owens Loffler, RN Performed by: Owens Loffler, RN Authorized by: Yevette Edwards, MD   Preanesthetic Checklist Completed: patient identified, IV checked, site marked, risks and benefits discussed, surgical consent, monitors and equipment checked, pre-op evaluation and timeout performed Spinal Block Patient position: sitting Prep: DuraPrep Patient monitoring: heart rate, cardiac monitor, continuous pulse ox and blood pressure Approach: midline Location: L3-4 Injection technique: single-shot Needle Needle type: Quincke  Needle gauge: 22 G Needle length: 5 cm Assessment Sensory level: T4 Events: CSF return

## 2023-04-08 NOTE — Interval H&P Note (Signed)
 History and Physical Interval Note:  04/08/2023 7:53 AM  Helen Garcia  has presented today for surgery, with the diagnosis of PRIMARY OSTEOARTHRITIS OF LEFT KNEE.  The various methods of treatment have been discussed with the patient and family. After consideration of risks, benefits and other options for treatment, the patient has consented to  Procedure(s): ARTHROPLASTY, KNEE, TOTAL, USING IMAGELESS COMPUTER-ASSISTED NAVIGATION (Left) as a surgical intervention.  The patient's history has been reviewed, patient examined, no change in status, stable for surgery.  I have reviewed the patient's chart and labs.  Questions were answered to the patient's satisfaction.     [name] P Tsutomu Barfoot

## 2023-04-09 ENCOUNTER — Encounter: Payer: Self-pay | Admitting: Orthopedic Surgery

## 2023-04-09 DIAGNOSIS — M1712 Unilateral primary osteoarthritis, left knee: Secondary | ICD-10-CM | POA: Diagnosis not present

## 2023-04-09 MED ORDER — ASPIRIN 81 MG PO CHEW
81.0000 mg | CHEWABLE_TABLET | Freq: Two times a day (BID) | ORAL | Status: DC
  Administered 2023-04-09: 81 mg via ORAL
  Filled 2023-04-09: qty 1

## 2023-04-09 MED ORDER — ASPIRIN 81 MG PO CHEW: 81.0000 mg | CHEWABLE_TABLET | Freq: Two times a day (BID) | ORAL | Status: DC

## 2023-04-09 MED ORDER — CELECOXIB 200 MG PO CAPS: 200.0000 mg | ORAL_CAPSULE | Freq: Two times a day (BID) | ORAL | 1 refills | Status: DC

## 2023-04-09 MED ORDER — TRAMADOL HCL 50 MG PO TABS
50.0000 mg | ORAL_TABLET | ORAL | 0 refills | Status: AC | PRN
Start: 2023-04-09 — End: ?

## 2023-04-09 MED ORDER — LEVOTHYROXINE SODIUM 112 MCG PO TABS
ORAL_TABLET | ORAL | Status: AC
  Filled 2023-04-09: qty 1

## 2023-04-09 NOTE — Anesthesia Postprocedure Evaluation (Signed)
 Anesthesia Post Note  Patient: Helen Garcia  Procedure(s) Performed: ARTHROPLASTY, KNEE, TOTAL, USING IMAGELESS COMPUTER-ASSISTED NAVIGATION (Left: Knee)  Patient location during evaluation: Nursing Unit Anesthesia Type: Spinal Level of consciousness: oriented and awake and alert Pain management: pain level controlled Vital Signs Assessment: post-procedure vital signs reviewed and stable Respiratory status: spontaneous breathing and respiratory function stable Cardiovascular status: blood pressure returned to baseline and stable Postop Assessment: no headache, no backache, no apparent nausea or vomiting and patient able to bend at knees Anesthetic complications: no   No notable events documented.   Last Vitals:  Vitals:   04/09/23 0307 04/09/23 0732  BP: 120/63 (!) 143/57  Pulse: 63 67  Resp: 16 16  Temp: (!) 36.4 C 36.7 C  SpO2: 96% 98%    Last Pain:  Vitals:   04/09/23 0732  TempSrc: Oral  PainSc: 4                  Starling Manns

## 2023-04-09 NOTE — Plan of Care (Signed)
 ?  Problem: Clinical Measurements: ?Goal: Respiratory complications will improve ?Outcome: Progressing ?  ?Problem: Activity: ?Goal: Risk for activity intolerance will decrease ?Outcome: Progressing ?  ?Problem: Coping: ?Goal: Level of anxiety will decrease ?Outcome: Progressing ?  ?Problem: Elimination: ?Goal: Will not experience complications related to urinary retention ?Outcome: Progressing ?  ?

## 2023-04-09 NOTE — Discharge Summary (Signed)
 Physician Discharge Summary  Subjective: 1 Day Post-Op Procedure(s) (LRB): ARTHROPLASTY, KNEE, TOTAL, USING IMAGELESS COMPUTER-ASSISTED NAVIGATION (Left) Patient reports pain as mild.   Patient seen in rounds with Dr. Ernest Pine. Patient is well, and has had no acute complaints or problems. Denies any CP, SOB, fevers or chills. States one episode of N/V with nursing. Did pass out briefly on bedside commode. Feeling better now We will start therapy today.  Patient is ready to go home  Physician Discharge Summary  Patient ID: Helen Garcia MRN: [id number] DOB/AGE: 05-13-1960 63 y.o.  Admit date: 04/08/2023 Discharge date: 04/09/2023  Admission Diagnoses:  Discharge Diagnoses:  Principal Problem:   History of total knee arthroplasty, left   Discharged Condition: good  Hospital Course: Patient presented to the hospital on 04/08/2023 for an elective left total knee arthroplasty. Patient was given 1g of TXA and 2g of Ancef prior to the procedure. she tolerated the procedure well without any complications. See procedural note below for details. Postoperatively, the patient did very well. she was able to pass PT protocols on post-op day one without any issues. States one episode of N/V with nursing. Did pass out briefly on bedside commode. Feeling better now. JP drain was removed without any difficulty and was intact. she was able to void her bladder without any difficulty. Physical exam was unremarkable. she denies any SOB, CP, fevers or chills. Vital signs are stable. Patient is stable to discharge home.   PROCEDURE:  Left total knee arthroplasty using computer-assisted navigation   SURGEON:  Jena Gauss. M.D.   ASSISTANT:  Gean Birchwood, PA-C (present and scrubbed throughout the case, critical for assistance with exposure, retraction, instrumentation, and closure)   ANESTHESIA: spinal   ESTIMATED BLOOD LOSS: 50 mL   FLUIDS REPLACED: 600 mL of crystalloid   TOURNIQUET TIME: 86  minutes   DRAINS: 2 medium Hemovac drains   SOFT TISSUE RELEASES: Anterior cruciate ligament, posterior cruciate ligament, deep medial collateral ligament, patellofemoral ligament   IMPLANTS UTILIZED: DePuy Attune size 5N posterior stabilized femoral component (cemented), size 4 rotating platform tibial component (cemented), 32 mm medialized dome patella (cemented), and a 7 mm stabilized rotating platform polyethylene insert.  Treatments: none  Discharge Exam: Blood pressure (!) 143/57, pulse 67, temperature 98 F (36.7 C), temperature source Oral, resp. rate 16, height 5\' 5"  (1.651 m), weight 111.1 kg, SpO2 98%.   Disposition: home   Allergies as of 04/09/2023       Reactions   Abatacept Hives, Itching   Codeine Nausea And Vomiting, Other (See Comments)   GI upset   Tomato Rash   Upset stomach        Medication List     STOP taking these medications    naproxen 500 MG tablet Commonly known as: NAPROSYN       TAKE these medications    aspirin 81 MG chewable tablet Chew 1 tablet (81 mg total) by mouth 2 (two) times daily after a meal.   celecoxib 200 MG capsule Commonly known as: CELEBREX Take 1 capsule (200 mg total) by mouth 2 (two) times daily.   clotrimazole-betamethasone cream Commonly known as: LOTRISONE Apply 1 Application topically 2 (two) times daily as needed (rash).   colestipol 1 g tablet Commonly known as: COLESTID Take 2 g by mouth 3 (three) times daily.   Cosentyx Sensoready (300 MG) 150 MG/ML Soaj Generic drug: Secukinumab (300 MG Dose) Inject 300 mg into the skin every 30 (thirty) days.   Farxiga 10 MG  Tabs tablet Generic drug: dapagliflozin propanediol Take 1 tablet by mouth daily.   multivitamin with minerals Tabs tablet Take 1 tablet by mouth daily.   oxyCODONE-acetaminophen 5-325 MG tablet Commonly known as: PERCOCET/ROXICET Take 1 tablet by mouth every 8 (eight) hours as needed for severe pain (pain score 7-10).    spironolactone 25 MG tablet Commonly known as: ALDACTONE Take 1 tablet by mouth daily.   Synthroid 112 MCG tablet Generic drug: levothyroxine Take 112 mcg by mouth daily before breakfast.   traMADol 50 MG tablet Commonly known as: ULTRAM Take 1-2 tablets (50-100 mg total) by mouth every 4 (four) hours as needed for moderate pain (pain score 4-6).               Durable Medical Equipment  (From admission, onward)           Start     Ordered   04/08/23 1153  DME Walker rolling  Once       Question:  Patient needs a walker to treat with the following condition  Answer:  Total knee replacement status   04/08/23 1152   04/08/23 1153  DME Bedside commode  Once       Comments: Patient is not able to walk the distance required to go the bathroom, or he/she is unable to safely negotiate stairs required to access the bathroom.  A 3in1 BSC will alleviate this problem  Question:  Patient needs a bedside commode to treat with the following condition  Answer:  Total knee replacement status   04/08/23 1152            Follow-up Information     Rayburn Go, PA-C Follow up on 04/23/2023.   Specialty: Orthopedic Surgery Why: at 9:45am Contact information: [address] [place] [address] [phone]         Donato Heinz, MD Follow up on 05/21/2023.   Specialty: Orthopedic Surgery Why: at 2:30pm Contact information: [address] [facility] [place] [address] [phone]                 Signed: Gean Birchwood 04/09/2023, 8:19 AM   Objective: Vital signs in last 24 hours: Temp:  [97 F (36.1 C)-98.6 F (37 C)] 98 F (36.7 C) (04/03 0732) Pulse Rate:  [62-96] 67 (04/03 0732) Resp:  [15-20] 16 (04/03 0732) BP: (115-143)/(57-80) 143/57 (04/03 0732) SpO2:  [93 %-98 %] 98 % (04/03 0732)  Intake/Output from previous day:  Intake/Output Summary (Last 24 hours) at 04/09/2023 0819 Last data filed at 04/09/2023 0300 Gross  per 24 hour  Intake 2072.42 ml  Output 1300 ml  Net 772.42 ml    Intake/Output this shift: No intake/output data recorded.  Labs: No results for input(s): "HGB" in the last 72 hours. No results for input(s): "WBC", "RBC", "HCT", "PLT" in the last 72 hours. No results for input(s): "NA", "K", "CL", "CO2", "BUN", "CREATININE", "GLUCOSE", "CALCIUM" in the last 72 hours. No results for input(s): "LABPT", "INR" in the last 72 hours.  EXAM: General - Patient is Alert, Appropriate, and Oriented Extremity - Neurologically intact Neurovascular intact Sensation intact distally Intact pulses distally Dorsiflexion/Plantar flexion intact No cellulitis present Compartment soft Dressing - dressing C/D/I and no drainage Motor Function - intact, moving foot and toes well on exam.  JP Drain pulled without difficulty. Intact  Assessment/Plan: 1 Day Post-Op Procedure(s) (LRB): ARTHROPLASTY, KNEE, TOTAL, USING IMAGELESS COMPUTER-ASSISTED NAVIGATION (Left) Procedure(s) (LRB): ARTHROPLASTY, KNEE, TOTAL, USING IMAGELESS COMPUTER-ASSISTED NAVIGATION (Left)  Past Medical History:  Diagnosis Date   Age-related osteoporosis without current pathological fracture    Aortic atherosclerosis (HCC)    Cavernous hemangioma of liver 2017   Cervical spondylosis    Chronic heart failure with preserved ejection fraction (HCC)    Degenerative arthritis of left knee    Depression    DOE (dyspnea on exertion)    Erythrocytosis    GERD (gastroesophageal reflux disease)    Glaucoma    Hepatic steatosis    History of hiatal hernia    HLD (hyperlipidemia)    HTN (hypertension)    Hypothyroidism    IBS (irritable bowel syndrome)    Insomnia    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   Weight as of this encounter: 111.1 kg.  Patient will continue to work with physical therapy   Discussed with the patient continuing to utilize Polar Care   Patient will use bone foam in 20-30 minute intervals   Patient will wear TED hose bilaterally to help prevent DVT and clot formation   Discussed the Aquacel bandage.  This bandage will stay in place 7 days postoperatively.  Can be replaced with honeycomb bandages that will be sent home with the patient   Discussed sending the patient home with tramadol and  at home percocet for as needed pain management.  Patient will also be sent home with Celebrex to help with swelling and inflammation.  Patient will take an 81 mg aspirin twice daily for DVT prophylaxis   JP drain removed without difficulty, intact   Weight-Bearing as tolerated to left leg   Patient will follow-up with [facility] clinic orthopedics in 2 weeks for staple removal and reevaluation  Diet - Regular diet Follow up - in 2 weeks Activity - WBAT Disposition - Home Condition Upon Discharge - Good DVT Prophylaxis - Aspirin and TED hose  Danise Edge, PA-C Orthopaedic Surgery 04/09/2023, 8:19 AM

## 2023-04-09 NOTE — Discharge Instructions (Signed)
 Instructions after Total Knee Replacement   James P. Angie Fava., M.D.    Dept. of Orthopaedics & Sports Medicine [facility] [address] [place], [place]  [address]  Phone: [phone]   Fax: [phone]       [email or web address]       DIET: Drink plenty of non-alcoholic fluids. Resume your normal diet. Include foods high in fiber.  ACTIVITY:  You may use crutches or a walker with weight-bearing as tolerated, unless instructed otherwise. You may be weaned off of the walker or crutches by your Physical Therapist.  Do NOT place pillows under the knee. Anything placed under the knee could limit your ability to straighten the knee.   Use the Bone Foam 3 times a day for 30 minutes each session to help straighten the knee. Continue doing gentle exercises. Exercising will reduce the pain and swelling, increase motion, and prevent muscle weakness.   Please continue to use the TED compression stockings for 6 weeks. You may remove the stockings at night, but should reapply them in the morning. Do not drive or operate any equipment until instructed.  WOUND CARE:  The initial dressing (Aquacel) can remain in place for 7 days (see separate instructions). Continue to use the PolarCare or ice packs periodically to reduce pain and swelling. You may bathe or shower after the staples are removed at the first office visit following surgery.  MEDICATIONS: You may resume your regular medications. Please take the pain medication as prescribed on the medication. Do not take pain medication on an empty stomach. Unless instructed otherwise, you should take an enteric-coated aspirin 81 mg. TWICE a day. (This along with elevation will help reduce the possibility of blood clots/phlebitis in your operated leg.) Use a stool softener (such as Senokot-S or Colace) daily and a laxative (such as Miralax or Dulcolax) as needed to prevent constipation.  Do not drive or drink alcoholic beverages when  taking pain medications.  CALL THE OFFICE FOR: Temperature above 101 degrees Excessive bleeding or drainage on the dressing. Excessive swelling, coldness, or paleness of the toes. Persistent nausea and vomiting.  FOLLOW-UP:  You should have an appointment to return to the office in 10-14 days after surgery. Arrangements have been made for continuation of Physical Therapy (either home therapy or outpatient therapy).\

## 2023-04-09 NOTE — Progress Notes (Signed)
 DISCHARGE NOTE:  Pt given discharge instructions, scripts and 2 honeycomb dressings. TED hose on both legs.  BSC and walker sent with pt. Pt wheeled to car by staff, friend providing transportation.

## 2023-04-09 NOTE — Progress Notes (Signed)
 Physical Therapy Treatment Patient Details Name: Helen Garcia MRN: [id number] DOB: 1960-02-22 Today's Date: 04/09/2023   History of Present Illness Patient is s/p L TKA.    PT Comments  Patient received in bed, she is agreeable to PT session. Patient is mod I with bed mobility and transfers. She ambulated ~50 feet with RW, supervision. Decreased pace due to pain. Ambulated up/down 4 steps with single rail and supervision. Then ambulated another 50 feet with RW and supervision. Patient reviewed HEP and completed with cga. Handout provided. Patient will continue to benefit from skilled PT to improve strength, safety and independence.     If plan is discharge home, recommend the following: A little help with walking and/or transfers;A little help with bathing/dressing/bathroom;Assist for transportation;Help with stairs or ramp for entrance   Can travel by private vehicle      yes  Equipment Recommendations  Rolling walker (2 wheels)    Recommendations for Other Services       Precautions / Restrictions Precautions Precautions: Fall Recall of Precautions/Restrictions: Intact Restrictions Weight Bearing Restrictions Per Provider Order: Yes LLE Weight Bearing Per Provider Order: Weight bearing as tolerated     Mobility  Bed Mobility Overal bed mobility: Modified Independent Bed Mobility: Supine to Sit     Supine to sit: Modified independent (Device/Increase time)          Transfers Overall transfer level: Needs assistance Equipment used: Rolling walker (2 wheels) Transfers: Sit to/from Stand Sit to Stand: Supervision                Ambulation/Gait Ambulation/Gait assistance: Supervision, Contact guard assist Gait Distance (Feet): 100 Feet Assistive device: Rolling walker (2 wheels) Gait Pattern/deviations: Step-to pattern, Decreased step length - right, Decreased step length - left, Decreased weight shift to left Gait velocity: decr     General Gait  Details: ambulates with slpw steady pace, pain limited. She ambulated 50 feet x2   Stairs Stairs: Yes Stairs assistance: Supervision Stair Management: One rail Right, Step to pattern, Sideways Number of Stairs: 4 General stair comments: patient is generally safe on steps, using single rail for balance and pain reduction   Wheelchair Mobility     Tilt Bed    Modified Rankin (Stroke Patients Only)       Balance Overall balance assessment: Modified Independent Sitting-balance support: Feet supported Sitting balance-Leahy Scale: Good     Standing balance support: Bilateral upper extremity supported, During functional activity, Reliant on assistive device for balance Standing balance-Leahy Scale: Good                              Communication Communication Communication: No apparent difficulties  Cognition Arousal: Alert Behavior During Therapy: WFL for tasks assessed/performed   PT - Cognitive impairments: No apparent impairments                         Following commands: Intact      Cueing Cueing Techniques: Verbal cues  Exercises Total Joint Exercises Ankle Circles/Pumps: AROM, Both, 10 reps Quad Sets: AROM, Left, 5 reps Short Arc Quad: AROM, Left, 10 reps Heel Slides: AAROM, Left, 10 reps Hip ABduction/ADduction: AAROM, Left, 5 reps Straight Leg Raises: AAROM, Left, 5 reps Long Arc Quad: AROM, Left, 5 reps Goniometric ROM: 0-85    General Comments General comments (skin integrity, edema, etc.): Post op incision d/c/i      Pertinent Vitals/Pain Pain Assessment Pain  Assessment: Faces Faces Pain Scale: Hurts whole lot Pain Location: L knee Pain Descriptors / Indicators: Discomfort, Grimacing, Operative site guarding Pain Intervention(s): Monitored during session, Repositioned, Ice applied    Home Living Family/patient expects to be discharged to:: Private residence Living Arrangements: Alone Available Help at Discharge:  Friend(s);Available PRN/intermittently Type of Home: House Home Access: Stairs to enter   Entrance Stairs-Number of Steps: 4 Alternate Level Stairs-Number of Steps: 7 up and 7 down Home Layout: Multi-level Home Equipment: Crutches;BSC/3in1 Additional Comments: Able to live on middle level with BSC    Prior Function            PT Goals (current goals can now be found in the care plan section) Acute Rehab PT Goals Patient Stated Goal: return home, feel better PT Goal Formulation: With patient Time For Goal Achievement: 04/15/23 Potential to Achieve Goals: Good Progress towards PT goals: Progressing toward goals    Frequency    BID      PT Plan      Co-evaluation              AM-PAC PT "6 Clicks" Mobility   Outcome Measure  Help needed turning from your back to your side while in a flat bed without using bedrails?: None Help needed moving from lying on your back to sitting on the side of a flat bed without using bedrails?: None Help needed moving to and from a bed to a chair (including a wheelchair)?: None Help needed standing up from a chair using your arms (e.g., wheelchair or bedside chair)?: None Help needed to walk in hospital room?: A Little Help needed climbing 3-5 steps with a railing? : A Little 6 Click Score: 22    End of Session Equipment Utilized During Treatment: Gait belt Activity Tolerance: Patient limited by fatigue;Patient limited by pain Patient left: in chair;with call bell/phone within reach Nurse Communication: Mobility status PT Visit Diagnosis: Other abnormalities of gait and mobility (R26.89);Muscle weakness (generalized) (M62.81);Difficulty in walking, not elsewhere classified (R26.2);Pain Pain - Right/Left: Left Pain - part of body: Knee     Time: 8295-6213 PT Time Calculation (min) (ACUTE ONLY): 36 min  Charges:    $Gait Training: 8-22 mins $Therapeutic Exercise: 8-22 mins PT General Charges $$ ACUTE PT VISIT: 1 Visit                      Ramadan Couey, PT, GCS 04/09/23,12:43 PM

## 2023-04-09 NOTE — Plan of Care (Signed)
  Problem: Safety: Goal: Ability to remain free from injury will improve Outcome: Progressing   Problem: Activity: Goal: Ability to avoid complications of mobility impairment will improve Outcome: Progressing   

## 2023-04-09 NOTE — Progress Notes (Signed)
 Occupational Therapy Evaluation Patient Details Name: Helen Garcia MRN: [id number] DOB: [birth year] Today's Date: 04/09/2023   History of Present Illness   Patient is s/p L TKA.     Clinical Impressions Pt seen for OT evaluation this date, POD#1 from above surgery. Pt was independent in all ADL/IADLs prior to surgery. Pt is eager to return to PLOF with less pain and improved safety and independence. Pt currently requires minimal assist for LB dressing while in seated position due to pain and limited AROM of L knee. Pt amb to BR will RW, MINA for IV pole management, chair close by due to pt reported increased nausea and dizziness this AM. Pt requested to sit down on the way back to room due to sudden wave of nausea. Pt reports she usually deals with nausea post surgery. RN notified. A friend with be staying with Pt at d/c for ~4 days to ensure pt safety returning home. Pt instructed in polar care mgt, falls prevention strategies, home/routines modifications, DME/AE for LB bathing and dressing tasks, and compression stocking mgt. Pt would benefit from skilled OT services including additional instruction in dressing techniques with or without assistive devices for dressing and bathing skills to support recall and carryover prior to discharge and ultimately to maximize safety, independence, and minimize falls risk and caregiver burden. OT will follow acutely.        If plan is discharge home, recommend the following:   A little help with walking and/or transfers;A little help with bathing/dressing/bathroom;Assistance with cooking/housework;Help with stairs or ramp for entrance     Functional Status Assessment   Patient has had a recent decline in their functional status and demonstrates the ability to make significant improvements in function in a reasonable and predictable amount of time.     Equipment Recommendations   BSC/3in1     Recommendations for Other Services          Precautions/Restrictions   Precautions Precautions: Fall Recall of Precautions/Restrictions: Intact Restrictions Weight Bearing Restrictions Per Provider Order: Yes LLE Weight Bearing Per Provider Order: Weight bearing as tolerated     Mobility Bed Mobility               General bed mobility comments: NT in recliner pre/post session    Transfers Overall transfer level: Needs assistance Equipment used: Rolling walker (2 wheels) Transfers: Sit to/from Stand, Bed to chair/wheelchair/BSC Sit to Stand: Contact guard assist           General transfer comment: Amb to BR with RW ~70ft with chair close as a precation since pt has been reporting of nausea and dizziness.      Balance Overall balance assessment: Needs assistance Sitting-balance support: Feet supported Sitting balance-Leahy Scale: Good     Standing balance support: Bilateral upper extremity supported, During functional activity, Reliant on assistive device for balance Standing balance-Leahy Scale: Fair                             ADL either performed or assessed with clinical judgement   ADL Overall ADL's : Needs assistance/impaired Eating/Feeding: Modified independent                   Lower Body Dressing: Sit to/from stand;Cueing for safety;Contact guard assist   Toilet Transfer: Contact guard assist;Ambulation;Regular Toilet;Rolling walker (2 wheels)   Toileting- Clothing Manipulation and Hygiene: Modified independent;Sitting/lateral lean       Functional mobility during ADLs: Rolling walker (  2 wheels);Contact guard assist General ADL Comments: Pt completed LB dressing STS from recliner, MINA for donning socks, CGA donning shorts     Vision Baseline Vision/History: 1 Wears glasses                         Pertinent Vitals/Pain Pain Assessment Pain Assessment: 0-10 Pain Score: 5  Pain Location: L knee Pain Descriptors / Indicators: Discomfort, Grimacing,  Operative site guarding Pain Intervention(s): Repositioned, Monitored during session, Ice applied     Extremity/Trunk Assessment Upper Extremity Assessment Upper Extremity Assessment: Overall WFL for tasks assessed   Lower Extremity Assessment Lower Extremity Assessment: LLE deficits/detail LLE Deficits / Details: anticipated pain and discomfort post op TKR   Cervical / Trunk Assessment Cervical / Trunk Assessment: Normal   Communication Communication Communication: No apparent difficulties   Cognition Arousal: Alert Behavior During Therapy: WFL for tasks assessed/performed Cognition: No apparent impairments             OT - Cognition Comments: A/Ox4                 Following commands: Intact       Cueing  General Comments   Cueing Techniques: Verbal cues  Post op incision d/c/i   Exercises Exercises: Other exercises Other Exercises Other Exercises: Edu: Role of OT, d/c planning, problem solving home mobility, LB dressing techniques   Shoulder Instructions      Home Living Family/patient expects to be discharged to:: Private residence Living Arrangements: Alone Available Help at Discharge: Friend(s);Available PRN/intermittently Type of Home: House Home Access: Stairs to enter Entrance Stairs-Number of Steps: 4   Home Layout: Multi-level Alternate Level Stairs-Number of Steps: 7 up and 7 down Alternate Level Stairs-Rails: Right Bathroom Shower/Tub: Estate manager/land agent Accessibility: No   Home Equipment: Crutches;BSC/3in1   Additional Comments: Able to live on middle level with BSC      Prior Functioning/Environment Prior Level of Function : Independent/Modified Independent;Driving;Working/employed             Mobility Comments: independent. Works at Programme researcher, broadcasting/film/video part time ADLs Comments: independent.    OT Problem List: Decreased strength;Decreased range of motion;Decreased activity tolerance;Impaired balance (sitting  and/or standing);Decreased safety awareness;Decreased knowledge of use of DME or AE   OT Treatment/Interventions: Self-care/ADL training;Therapeutic exercise;Energy conservation;DME and/or AE instruction;Therapeutic activities      OT Goals(Current goals can be found in the care plan section)   Acute Rehab OT Goals Patient Stated Goal: to have less pain OT Goal Formulation: With patient Time For Goal Achievement: 04/23/23 Potential to Achieve Goals: Good ADL Goals Pt Will Perform Grooming: with modified independence;sitting Pt Will Perform Lower Body Dressing: with modified independence;with adaptive equipment;sit to/from stand Pt Will Transfer to Toilet: with modified independence;ambulating;regular height toilet Pt Will Perform Toileting - Clothing Manipulation and hygiene: with modified independence;sit to/from stand   OT Frequency:  Min 2X/week    Co-evaluation              AM-PAC OT "6 Clicks" Daily Activity     Outcome Measure Help from another person eating meals?: None Help from another person taking care of personal grooming?: A Little Help from another person toileting, which includes using toliet, bedpan, or urinal?: A Little Help from another person bathing (including washing, rinsing, drying)?: A Little Help from another person to put on and taking off regular upper body clothing?: None Help from another person to put on and taking  off regular lower body clothing?: None 6 Click Score: 21   End of Session Equipment Utilized During Treatment: Gait belt;Rolling walker (2 wheels) Nurse Communication: Mobility status  Activity Tolerance: Patient tolerated treatment well Patient left: in chair;with call bell/phone within reach;with nursing/sitter in room  OT Visit Diagnosis: Unsteadiness on feet (R26.81);Other abnormalities of gait and mobility (R26.89);Muscle weakness (generalized) (M62.81)                Time: 1610-9604 OT Time Calculation (min): 35  min Charges:  OT General Charges $OT Visit: 1 Visit OT Evaluation $OT Eval Low Complexity: 1 Low OT Treatments $Self Care/Home Management : 8-22 mins  Glenard Haring M.S. OTR/L  04/09/23, 10:41 AM

## 2023-08-13 ENCOUNTER — Other Ambulatory Visit: Payer: Self-pay | Admitting: Family Medicine

## 2023-08-13 DIAGNOSIS — Z1231 Encounter for screening mammogram for malignant neoplasm of breast: Secondary | ICD-10-CM

## 2023-09-02 ENCOUNTER — Ambulatory Visit
Admission: RE | Admit: 2023-09-02 | Discharge: 2023-09-02 | Disposition: A | Discharge status: Home / Self Care | Source: Ambulatory Visit | Attending: Family Medicine | Admitting: Family Medicine

## 2023-09-02 DIAGNOSIS — Z1231 Encounter for screening mammogram for malignant neoplasm of breast: Secondary | ICD-10-CM | POA: Insufficient documentation

## 2023-09-29 ENCOUNTER — Ambulatory Visit (INDEPENDENT_AMBULATORY_CARE_PROVIDER_SITE_OTHER): Payer: Medicare Other | Admitting: Dermatology

## 2023-09-29 DIAGNOSIS — L821 Other seborrheic keratosis: Secondary | ICD-10-CM | POA: Diagnosis not present

## 2023-09-29 DIAGNOSIS — L814 Other melanin hyperpigmentation: Secondary | ICD-10-CM

## 2023-09-29 DIAGNOSIS — L578 Other skin changes due to chronic exposure to nonionizing radiation: Secondary | ICD-10-CM

## 2023-09-29 DIAGNOSIS — D1801 Hemangioma of skin and subcutaneous tissue: Secondary | ICD-10-CM | POA: Diagnosis not present

## 2023-09-29 DIAGNOSIS — W908XXA Exposure to other nonionizing radiation, initial encounter: Secondary | ICD-10-CM

## 2023-09-29 DIAGNOSIS — L409 Psoriasis, unspecified: Secondary | ICD-10-CM

## 2023-09-29 DIAGNOSIS — L918 Other hypertrophic disorders of the skin: Secondary | ICD-10-CM | POA: Diagnosis not present

## 2023-09-29 NOTE — Patient Instructions (Addendum)
 Cryotherapy Aftercare  Wash gently with soap and water  everyday.   Apply Vaseline and Band-Aid daily until healed.   Seborrheic Keratosis  What causes seborrheic keratoses? Seborrheic keratoses are harmless, common skin growths that first appear during adult life.  As time goes by, more growths appear.  Some people may develop a large number of them.  Seborrheic keratoses appear on both covered and uncovered body parts.  They are not caused by sunlight.  The tendency to develop seborrheic keratoses can be inherited.  They vary in color from skin-colored to gray, brown, or even black.  They can be either smooth or have a rough, warty surface.   Seborrheic keratoses are superficial and look as if they were stuck on the skin.  Under the microscope this type of keratosis looks like layers upon layers of skin.  That is why at times the top layer may seem to fall off, but the rest of the growth remains and re-grows.    Treatment Seborrheic keratoses do not need to be treated, but can easily be removed in the office.  Seborrheic keratoses often cause symptoms when they rub on clothing or jewelry.  Lesions can be in the way of shaving.  If they become inflamed, they can cause itching, soreness, or burning.  Removal of a seborrheic keratosis can be accomplished by freezing, burning, or surgery. If any spot bleeds, scabs, or grows rapidly, please return to have it checked, as these can be an indication of a skin cancer.    Melanoma ABCDEs  Melanoma is the most dangerous type of skin cancer, and is the leading cause of death from skin disease.  You are more likely to develop melanoma if you: Have light-colored skin, light-colored eyes, or red or blond hair Spend a lot of time in the sun Tan regularly, either outdoors or in a tanning bed Have had blistering sunburns, especially during childhood Have a close family member who has had a melanoma Have atypical moles or large birthmarks  Early detection  of melanoma is key since treatment is typically straightforward and cure rates are extremely high if we catch it early.   The first sign of melanoma is often a change in a mole or a new dark spot.  The ABCDE system is a way of remembering the signs of melanoma.  A for asymmetry:  The two halves do not match. B for border:  The edges of the growth are irregular. C for color:  A mixture of colors are present instead of an even brown color. D for diameter:  Melanomas are usually (but not always) greater than 6mm - the size of a pencil eraser. E for evolution:  The spot keeps changing in size, shape, and color.  Please check your skin once per month between visits. You can use a small mirror in front and a large mirror behind you to keep an eye on the back side or your body.   If you see any new or changing lesions before your next follow-up, please call to schedule a visit.  Please continue daily skin protection including broad spectrum sunscreen SPF 30+ to sun-exposed areas, reapplying every 2 hours as needed when you're outdoors.   Staying in the shade or wearing long sleeves, sun glasses (UVA+UVB protection) and wide brim hats (4-inch brim around the entire circumference of the hat) are also recommended for sun protection.    Due to recent changes in healthcare laws, you may see results of your pathology and/or  laboratory studies on MyChart before the doctors have had a chance to review them. We understand that in some cases there may be results that are confusing or concerning to you. Please understand that not all results are received at the same time and often the doctors may need to interpret multiple results in order to provide you with the best plan of care or course of treatment. Therefore, we ask that you please give us  2 business days to thoroughly review all your results before contacting the office for clarification. Should we see a critical lab result, you will be contacted  sooner.   If You Need Anything After Your Visit  If you have any questions or concerns for your doctor, please call our main line at [phone] and press option 4 to reach your doctor's medical assistant. If no one answers, please leave a voicemail as directed and we will return your call as soon as possible. Messages left after 4 pm will be answered the following business day.   You may also send us  a message via MyChart. We typically respond to MyChart messages within 1-2 business days.  For prescription refills, please ask your pharmacy to contact our office. Our fax number is [phone].  If you have an urgent issue when the clinic is closed that cannot wait until the next business day, you can page your doctor at the number below.    Please note that while we do our best to be available for urgent issues outside of office hours, we are not available 24/7.   If you have an urgent issue and are unable to reach us , you may choose to seek medical care at your doctor's office, retail clinic, urgent care center, or emergency room.  If you have a medical emergency, please immediately call 911 or go to the emergency department.  Pager Numbers  - Dr. Hester: [phone]  - Dr. Jackquline: [phone]  - Dr. Claudene: [phone]   - Dr. Raymund: [phone]  In the event of inclement weather, please call our main line at [phone] for an update on the status of any delays or closures.  Dermatology Medication Tips: Please keep the boxes that topical medications come in in order to help keep track of the instructions about where and how to use these. Pharmacies typically print the medication instructions only on the boxes and not directly on the medication tubes.   If your medication is too expensive, please contact our office at [phone] option 4 or send us  a message through MyChart.   We are unable to tell what your co-pay for medications will be in advance as this is  different depending on your insurance coverage. However, we may be able to find a substitute medication at lower cost or fill out paperwork to get insurance to cover a needed medication.   If a prior authorization is required to get your medication covered by your insurance company, please allow us  1-2 business days to complete this process.  Drug prices often vary depending on where the prescription is filled and some pharmacies may offer cheaper prices.  The website www.goodrx.com contains coupons for medications through different pharmacies. The prices here do not account for what the cost may be with help from insurance (it may be cheaper with your insurance), but the website can give you the price if you did not use any insurance.  - You can print the associated coupon and take it with your prescription to the pharmacy.  - You  may also stop by our office during regular business hours and pick up a GoodRx coupon card.  - If you need your prescription sent electronically to a different pharmacy, notify our office through [facility] or by phone at [phone] option 4.     Si Usted Necesita Algo Despus de Su Visita  Tambin puede enviarnos un mensaje a travs de Clinical cytogeneticist. Por lo general respondemos a los mensajes de MyChart en el transcurso de 1 a 2 das hbiles.  Para renovar recetas, por favor pida a su farmacia que se ponga en contacto con nuestra oficina. Randi lakes de fax es [employer] [phone].  Si tiene un asunto urgente cuando la clnica est cerrada y que no puede esperar hasta el siguiente da hbil, puede llamar/localizar a su doctor(a) al nmero que aparece a continuacin.   Por favor, tenga en cuenta que aunque hacemos todo lo posible para estar disponibles para asuntos urgentes fuera del horario de [place], no estamos disponibles las 24 horas del da, los 7 [address]  [address] de la [place].   Si tiene un problema urgente y no puede comunicarse con nosotros, puede optar por buscar  atencin mdica  en el consultorio de su doctor(a), en una clnica privada, en un centro de atencin urgente o en una sala de emergencias.  Si tiene Engineer, drilling, por favor llame inmediatamente al 911 o vaya a la sala de emergencias.  Nmeros de bper  - Dr. Hester: [phone]  - Dra. Jackquline: [phone]  - Dr. Claudene: [phone]  - Dra. Kitts: [phone]  En caso de inclemencias del [place], por favor llame a nuestra lnea principal al [phone] para una actualizacin sobre el estado de cualquier retraso o cierre.  Consejos para la medicacin en dermatologa: Por favor, guarde las cajas en las que vienen los medicamentos de uso tpico para ayudarle a seguir las instrucciones sobre dnde y cmo usarlos. Las farmacias generalmente imprimen las instrucciones del medicamento slo en las cajas y no directamente en los tubos del [place].   Si su medicamento es muy caro, por favor, pngase en contacto con landry rieger llamando al [phone] y presione la opcin 4 o envenos un mensaje a travs de Clinical cytogeneticist.   No podemos decirle cul ser su copago por los medicamentos por adelantado ya que esto es diferente dependiendo de la cobertura de su seguro. Sin embargo, es posible que podamos encontrar un medicamento sustituto a Audiological scientist un formulario para que el seguro cubra el medicamento que se considera necesario.   Si se requiere una autorizacin previa para que su compaa de seguros malta su medicamento, por favor permtanos de 1 a 2 das hbiles para completar este proceso.  Los precios de los medicamentos varan con frecuencia dependiendo del Environmental consultant de dnde se surte la receta y alguna farmacias pueden ofrecer precios ms baratos.  El sitio web www.goodrx.com tiene cupones para medicamentos de Health and safety inspector. Los precios aqu no tienen en cuenta lo que podra costar con la ayuda del seguro (puede ser ms barato con su seguro), pero el sitio web puede  darle el precio si no utiliz Tourist information centre manager.  - Puede imprimir el cupn correspondiente y llevarlo con su receta a la farmacia.  - Tambin puede pasar por nuestra oficina durante el horario de atencin regular y Education officer, museum una tarjeta de cupones de GoodRx.  - Si necesita que su receta se enve electrnicamente a Psychiatrist, informe a nuestra oficina a travs de MyChart de [employer] o por telfono  llamando al [phone] y presione la opcin 4.

## 2023-09-29 NOTE — Progress Notes (Signed)
 New Patient Visit   Subjective  Helen Garcia is a 63 y.o. female who presents for the following: for spots at neck and under arms that get irritated and rub on clothing.  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 Currently on Consentyx and doing well since 2017. Does not use topical treatments treatments and is currently clear and doesn't want to address.  The following portions of the chart were reviewed this encounter and updated as appropriate: medications, allergies, medical history  Review of Systems:  No other skin or systemic complaints except as noted in HPI or Assessment and Plan.  Objective  Well appearing patient in no apparent distress; mood and affect are within normal limits.   A focused examination was performed of the following areas: Arms, neck, b/l axilla   Relevant exam findings are noted in the Assessment and Plan.  left axilla x 4, right axilla x 3, left lower neck x 2 (7) Fleshy papules  Assessment & Plan   LENTIGINES Exam: scattered tan macules Due to sun exposure Treatment Plan: Benign-appearing, observe. Recommend daily broad spectrum sunscreen SPF 30+ to sun-exposed areas, reapply every 2 hours as needed.  Call for any changes  HEMANGIOMA Exam: red papule(s) Discussed benign nature. Recommend observation. Call for changes.  SEBORRHEIC KERATOSIS - Stuck-on, waxy, tan-brown papules and/or plaques  - Benign-appearing - Discussed benign etiology and prognosis. - Observe - Call for any changes  ACTINIC DAMAGE - chronic, secondary to cumulative UV radiation exposure/sun exposure over time - diffuse scaly erythematous macules with underlying dyspigmentation - Recommend daily broad spectrum sunscreen SPF 30+ to sun-exposed areas, reapply every 2 hours as needed.  - Recommend staying in the shade or wearing long sleeves, sun glasses (UVA+UVB protection)  and wide brim hats (4-inch brim around the entire circumference of the hat). - Call for new or changing lesions.  PSORIASIS Exam: pt clear today. <1% BSA.  Chronic condition with duration or expected duration over one year. Currently well-controlled.   patient denies joint pain  Psoriasis is a chronic non-curable, but treatable genetic/hereditary disease that may have other systemic features affecting other organ systems such as joints (Psoriatic Arthritis). It is associated with an increased risk of inflammatory bowel disease, heart disease, non-alcoholic fatty liver disease, and depression.  Treatments include light and laser treatments; topical medications; and systemic medications including oral and injectables.  Treatment Plan: Continue Cosentyx as prescribed by rheumatologist   SKIN TAG (7) left axilla x 4, right axilla x 3, left lower neck x 2 (7) Symptomatic, irritating, patient would like treated.   Patient is bothered by irritated by rubbing and clothing   Cryotherapy today    Destruction of lesion - left axilla x 4, right axilla x 3, left lower neck x 2 (7)  Destruction method: cryotherapy   Informed consent: discussed and consent obtained   Lesion destroyed using liquid nitrogen: Yes   Region frozen until ice ball extended beyond lesion: Yes   Outcome: patient tolerated procedure well with no complications   Post-procedure details: wound care instructions given   Additional details:  Prior to procedure, discussed risks of blister formation, small wound, skin dyspigmentation, or rare scar following cryotherapy. Recommend Vaseline ointment to treated areas while healing.    Return in 1 year (on 09/28/2024) for ubse .  I, Eleanor Blush, CMA, am acting as scribe for Rexene Rattler, MD.   Documentation: I have reviewed  the above documentation for accuracy and completeness, and I agree with the above.  Rexene Rattler, MD

## 2023-10-09 ENCOUNTER — Encounter: Payer: Self-pay | Admitting: Oncology

## 2023-10-09 DIAGNOSIS — D751 Secondary polycythemia: Secondary | ICD-10-CM

## 2023-10-09 NOTE — Telephone Encounter (Deleted)
 Per Dr. Melanee I would hold off on phlebotomy at this time since she does not have polycythemia vera.  We will plan to get a repeat CBC in about 6 weeks time for possible phlebotomy if her hematocrit is higher.  Outbound call to patient;

## 2023-10-09 NOTE — Telephone Encounter (Signed)
 I would hold off on phlebotomy at this time since she does not have polycythemia vera.  We will plan to get a repeat CBC in about 6 weeks time for possible phlebotomy if her hematocrit is higher

## 2023-10-15 ENCOUNTER — Encounter: Payer: Self-pay | Admitting: Emergency Medicine

## 2023-10-15 ENCOUNTER — Other Ambulatory Visit: Payer: Self-pay | Admitting: Orthopedic Surgery

## 2023-10-15 DIAGNOSIS — M25511 Pain in right shoulder: Secondary | ICD-10-CM

## 2023-10-16 ENCOUNTER — Other Ambulatory Visit: Payer: Self-pay | Admitting: Emergency Medicine

## 2023-10-16 DIAGNOSIS — K7689 Other specified diseases of liver: Secondary | ICD-10-CM

## 2023-10-16 DIAGNOSIS — D1803 Hemangioma of intra-abdominal structures: Secondary | ICD-10-CM

## 2023-10-20 ENCOUNTER — Ambulatory Visit

## 2023-10-27 ENCOUNTER — Ambulatory Visit

## 2023-11-10 ENCOUNTER — Ambulatory Visit
Admission: RE | Admit: 2023-11-10 | Discharge: 2023-11-10 | Disposition: A | Discharge status: Home / Self Care | Source: Ambulatory Visit | Attending: Orthopedic Surgery | Admitting: Orthopedic Surgery

## 2023-11-10 ENCOUNTER — Ambulatory Visit
Admission: RE | Admit: 2023-11-10 | Discharge: 2023-11-10 | Disposition: A | Discharge status: Home / Self Care | Source: Ambulatory Visit | Attending: Internal Medicine | Admitting: Internal Medicine

## 2023-11-10 DIAGNOSIS — D1803 Hemangioma of intra-abdominal structures: Secondary | ICD-10-CM

## 2023-11-10 DIAGNOSIS — M25511 Pain in right shoulder: Secondary | ICD-10-CM | POA: Insufficient documentation

## 2023-11-10 DIAGNOSIS — K7689 Other specified diseases of liver: Secondary | ICD-10-CM | POA: Insufficient documentation

## 2023-11-10 MED ORDER — GADOBUTROL 1 MMOL/ML IV SOLN
10.0000 mL | Freq: Once | INTRAVENOUS | Status: AC | PRN
  Administered 2023-11-10: 10 mL via INTRAVENOUS

## 2023-11-19 ENCOUNTER — Encounter: Payer: Self-pay | Admitting: Oncology

## 2023-11-19 NOTE — Telephone Encounter (Signed)
 Ok to schedule 1 session of phlebotomy for her

## 2023-11-30 ENCOUNTER — Other Ambulatory Visit: Payer: Self-pay | Admitting: Nurse Practitioner

## 2023-11-30 ENCOUNTER — Inpatient Hospital Stay: Attending: Oncology

## 2023-11-30 DIAGNOSIS — D751 Secondary polycythemia: Secondary | ICD-10-CM | POA: Diagnosis present

## 2023-11-30 NOTE — Progress Notes (Signed)
 Reviewed Dr. Darold telephone note and lab work proceed with phlebotomy 500 ml once today due to Hct of 53

## 2023-11-30 NOTE — Patient Instructions (Signed)

## 2024-01-14 NOTE — Progress Notes (Signed)
 "   Referring Physician:  Mardee Lynwood SQUIBB, MD [address] [facility] [place],  [place] [address]  Primary Physician:  Alla Amis, MD  Discussed the use of AI scribe software for clinical note transcription with the patient, who gave verbal consent to proceed.  History of Present Illness Helen Garcia is a 64 year old female with right knee arthroplasty who presents with progressive right knee pain and mechanical symptoms.  Since summer, she has had progressively worsening right knee pain that began while in a pool. Pain is dull and throbbing with intermittent severe, searing episodes that cause the knee to buckle and have led to falls, including one on Thanksgiving. Pain is localized to the knee without radiation, paresthesias, or weakness, and does not reach the foot or ankle. She limps, has limited right leg motion especially with twisting, and is unable to exercise due to pain and restricted range of motion.  She notes clicking in the knee with a focal area of maximal pain and clicking just below the tibial plateau. Symptoms fluctuate, with some days less severe, but acute episodes are debilitating. She denies recent infections or systemic symptoms.  She completed three 12-week courses of PT, including aquatic therapy, without improvement. She previously received intra-articular corticosteroid injections without benefit. Oral anti-inflammatories and Percocet give partial relief, but she developed GI intolerance to oral anti-inflammatories and now uses topical diclofenac. She has not had a knee MRI or any nerve-directed procedures such as genicular nerve blocks.  She has psoriatic arthritis with flares involving multiple joints, tendons, and ligaments, including recent hand swelling and prior shoulder and ankle involvement. She is unsure if the current knee symptoms are related to a psoriatic flare or a separate process.   Review of Systems:  A 10 point review of  systems is negative, except for the pertinent positives and negatives detailed in the HPI.  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)    Long term (current) use of immunosuppressive biologic    a.) secukinumab for psoriatic arthritis   Morbid obesity with BMI of 40.0-44.9, adult (HCC)    OSA on CPAP    Osteoarthritis    Plaque psoriasis    Polycythemia, secondary    PONV (postoperative nausea and vomiting)    Psoriatic arthritis (HCC)    a.) Tx'd with secukinumab   Tenosynovitis of fingers    Uveitis    Vasovagal episode     Past Surgical History: Past Surgical History:  Procedure Laterality Date   ABDOMINAL HYSTERECTOMY     ANKLE ARTHROSCOPY Right 08/17/2020   Procedure: ANKLE ARTHROSCOPY- OCD repair; debridement, extensive;  Surgeon: Ashley Soulier, DPM;  Location: [facility];  Service: Podiatry;  Laterality: Right;   ANKLE RECONSTRUCTION Right 08/17/2020   Procedure: RECONSTRUCTION ANKLE- Brostrum-Gould;  Surgeon: Ashley Soulier, DPM;  Location: [facility];  Service: Podiatry;  Laterality: Right;   APPENDECTOMY     CHOLECYSTECTOMY     EYE SURGERY Left 1974   eye muscle   HAMMER TOE SURGERY Left 07/23/2022   Procedure: HAMMER TOE CORRECTION - SECOND AND THIRD;  Surgeon: Ashley Soulier, DPM;  Location: [facility];  Service: Podiatry;  Laterality: Left;   KNEE ARTHROPLASTY Left  04/08/2023   Procedure: ARTHROPLASTY, KNEE, TOTAL, USING IMAGELESS COMPUTER-ASSISTED NAVIGATION;  Surgeon: Mardee Lynwood SQUIBB, MD;  Location: [facility];  Service: Orthopedics;  Laterality: Left;   KNEE ARTHROSCOPY WITH MEDIAL MENISECTOMY Left 03/12/2017   Procedure: KNEE ARTHROSCOPY WITH MEDIAL MENISECTOMY;  Surgeon: Kathlynn Sharper, MD;  Location: [facility];  Service: Orthopedics;  Laterality: Left;   TENDON REPAIR Right 08/17/2020   Procedure: TENDON REPAIR- Flexor tendon repair-second; Tenolysis, multiple;  Surgeon: Ashley Soulier, DPM;  Location: [facility];  Service: Podiatry;  Laterality: Right;   TONSILLECTOMY      Allergies: Allergies as of 01/20/2024 - Review Complete 01/20/2024  Allergen Reaction Noted   Abatacept Hives and Itching 02/19/2016   Codeine Nausea And Vomiting and Other (See Comments) 08/20/2015   Tomato Rash 11/03/2018    Medications: Current Medications[1]  Social History: Social History[2]  Family Medical History: Family History  Problem Relation Age of Onset   Multiple sclerosis Mother    Cirrhosis Father    Breast cancer Neg Hx     Physical Examination: Vitals:   01/20/24 1500  BP: 126/76    General: Patient is in no apparent distress. Attention to examination is appropriate.  Neck:   Supple.  Full range of motion.  Respiratory: Patient is breathing without any difficulty.   NEUROLOGICAL:     Awake, alert, oriented to person, place, and time.  Speech is clear and fluent.   Cranial Nerves: Pupils equal round and reactive to light.  Facial tone is symmetric. Shoulder shrug is symmetric. Tongue protrusion is midline.  There is no pronator drift.  No clear tinel at peroneal nerve, No motor or sensory deficits in peroneal nerve distribution.    Assessment and Plan Assessment & Plan Knee pain after joint replacement, possible psoriatic arthritis flare She has progressive knee pain following joint replacement, characterized by mechanical symptoms and  intermittent severe pain, without neuropathic features or evidence of prosthetic complication. Radiographs show a well-aligned prosthesis and no major structural abnormality per recent orthopedics evaluation. Differential includes psoriatic arthritis flare, tendinous or ligamentous inflammation, and anatomic variant causing clicking. No  clear evidence of nerve injury or infection. The click is likely incidental and not the pain source. Nerve involvement is unlikely, and further nerve testing is not indicated. Etiology may be inflammatory, warranting further rheumatologic evaluation. - Deferred nerve conduction study due to low suspicion for nerve involvement and her preference to avoid further needle-based testing. - Deferred genicular nerve block/injection as she does not wish to pursue this intervention at this time. I do think she may benefit from this however - Deferred MRI; recommended she discuss imaging with her orthopedic surgeon or rheumatologist, as MRI from this specialty would be protocolled for nerve pathology, which is not suspected. - Recommended she consult her rheumatologist to evaluate for psoriatic arthritis flare and consider further rheumatologic management. - Provided reassurance regarding absence of nerve injury or prosthetic complication. - Offered further assistance if needed.  Thank you for involving me in the care of this patient.   Penne MICAEL Sharps MD/MSCR Neurosurgery - Peripheral Nerve Surgery       [  1]  Current Outpatient Medications:    albuterol (VENTOLIN HFA) 108 (90 Base) MCG/ACT inhaler, SMARTSIG:2 inhalation Via Inhaler Every 6 Hours PRN, Disp: , Rfl:    calcium carbonate (OS-CAL - DOSED IN MG OF ELEMENTAL CALCIUM) 1250 (500 Ca) MG tablet, Take by mouth., Disp: , Rfl:    clotrimazole-betamethasone (LOTRISONE) cream, Apply 1 Application topically 2 (two) times daily as needed (rash)., Disp: , Rfl:    COSENTYX SENSOREADY, 300 MG, 150 MG/ML SOAJ, Inject 300 mg  into the skin every 30 (thirty) days., Disp: , Rfl:    FARXIGA  10 MG TABS tablet, Take 1 tablet by mouth daily., Disp: , Rfl:    Multiple Vitamin (MULTIVITAMIN WITH MINERALS) TABS tablet, Take 1 tablet by mouth daily., Disp: , Rfl:    oxyCODONE -acetaminophen  (PERCOCET/ROXICET) 5-325 MG tablet, Take 1 tablet by mouth every 8 (eight) hours as needed for severe pain (pain score 7-10)., Disp: , Rfl:    SYNTHROID  112 MCG tablet, Take 112 mcg by mouth daily before breakfast., Disp: , Rfl:    tirzepatide (ZEPBOUND) 2.5 MG/0.5ML injection vial, Inject 2.5 mg into the skin., Disp: , Rfl:    traMADol  (ULTRAM ) 50 MG tablet, Take 1-2 tablets (50-100 mg total) by mouth every 4 (four) hours as needed for moderate pain (pain score 4-6)., Disp: 30 tablet, Rfl: 0 [2]  Social History Tobacco Use   Smoking status: Former    Types: Cigarettes   Smokeless tobacco: Never  Vaping Use   Vaping status: Never Used  Substance Use Topics   Alcohol use: No   Drug use: Not Currently    Types: Marijuana    Comment: no use since 2019   "

## 2024-01-15 ENCOUNTER — Emergency Department (HOSPITAL_COMMUNITY)
Admission: EM | Admit: 2024-01-15 | Discharge: 2024-01-15 | Disposition: A | Discharge status: Home / Self Care | Attending: Emergency Medicine | Admitting: Emergency Medicine

## 2024-01-15 ENCOUNTER — Other Ambulatory Visit: Payer: Self-pay

## 2024-01-15 ENCOUNTER — Ambulatory Visit: Admission: EM | Admit: 2024-01-15 | Discharge: 2024-01-15 | Disposition: A | Discharge status: Home / Self Care

## 2024-01-15 DIAGNOSIS — M7989 Other specified soft tissue disorders: Secondary | ICD-10-CM | POA: Diagnosis not present

## 2024-01-15 DIAGNOSIS — Z862 Personal history of diseases of the blood and blood-forming organs and certain disorders involving the immune mechanism: Secondary | ICD-10-CM

## 2024-01-15 DIAGNOSIS — M79601 Pain in right arm: Secondary | ICD-10-CM | POA: Diagnosis not present

## 2024-01-15 DIAGNOSIS — M79631 Pain in right forearm: Secondary | ICD-10-CM | POA: Diagnosis present

## 2024-01-15 NOTE — Discharge Instructions (Signed)
 Go to the emergency department for the pain and swelling in your right hand and forearm, with concern for possible blood clot.

## 2024-01-15 NOTE — ED Provider Triage Note (Signed)
 Emergency Medicine Provider Triage Evaluation Note  Helen Garcia , a 64 y.o. female  was evaluated in triage.  Pt complains of right upper extremity pain and swelling, history of psoriatic arthritis and polycythemia vera.  Review of Systems  Positive: Pain, swelling Negative: Injury, blood thinner, fever, chills, erythema, SHOB, CP  Physical Exam  BP (!) 180/99 (BP Location: Left Wrist)   Pulse 92   Temp 98 F (36.7 C)   Resp 18   SpO2 97%  Gen:   Awake, no distress   Resp:  Normal effort  MSK:   Swelling to right dorsum of hand at the 2nd and 3rd metacarpals with no other appreciable edema.  Patient does have pain with palpation of the right upper extremity at the forearm and distally.  Patient neurovascularly intact with +2 radial pulses Other:    Medical Decision Making  Medically screening exam initiated at 7:36 PM.  Appropriate orders placed.  Helen Garcia was informed that the remainder of the evaluation will be completed by another provider, this initial triage assessment does not replace that evaluation, and the importance of remaining in the ED until their evaluation is complete.     Helen Ileana SAILOR, PA-C 01/15/24 1938

## 2024-01-15 NOTE — Discharge Instructions (Addendum)
 Today you were seen for swelling of your right upper extremity with pain.  We have arranged for you to have an outpatient ultrasound to rule out a blood clot.  Please return to the ED if you have chest pain, shortness of breath, or severe pain.  Thank you for letting us  treat you today. After performing a physical exam, I feel you are safe to go home. Please follow up with your PCP in the next several days and provide them with your records from this visit. Return to the Emergency Room if pain becomes severe or symptoms worsen.

## 2024-01-15 NOTE — ED Provider Notes (Signed)
 " Helen Garcia    CSN: [id number] Arrival date & time: 01/15/24  1648      History   Chief Complaint Chief Complaint  Patient presents with   Hand Problem    HPI Helen Garcia is a 64 y.o. female.  Patient presents with acute onset of pain and swelling in her right hand which is becoming progressively worse.  Her symptoms started this afternoon.  She is now having difficulty using her hand; Her fingers are red and swollen.  The pain and swelling are progressing to her forearm and radiate to her elbow.  No trauma.  No chest pain or shortness of breath.  Patient reports history of secondary polycythemia and is concerned for possible blood clot.  The history is provided by the patient and medical records.    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)    Long term (current) use of immunosuppressive biologic    a.) secukinumab for psoriatic arthritis   Morbid obesity with BMI of 40.0-44.9, adult (HCC)    OSA on CPAP    Osteoarthritis    Plaque psoriasis    Polycythemia, secondary    PONV (postoperative nausea and vomiting)    Psoriatic arthritis (HCC)    a.) Tx'd with secukinumab   Tenosynovitis of fingers    Uveitis    Vasovagal episode     Patient Active Problem List   Diagnosis Date Noted   History of total knee arthroplasty, left 04/08/2023   Class 3 severe obesity due to excess calories with  body mass index (BMI) of 40.0 to 44.9 in adult (HCC) 02/23/2023   Tenosynovitis of fingers 05/09/2021   Pain in left finger(s) 05/08/2021   Hip joint instability, left 05/04/2019   OSA on CPAP 01/03/2019   Age-related osteoporosis without current pathological fracture 11/03/2018   Chronic fatigue 09/29/2018   SOBOE (shortness of breath on exertion) 06/25/2018   Acquired hypothyroidism 05/25/2018   Encounter for general adult medical examination without abnormal findings 05/25/2018   Morbid obesity with BMI of 40.0-44.9, adult (HCC) 05/25/2018   Unspecified osteoarthritis, unspecified site 03/20/2018   Chronic heart failure with preserved ejection fraction (HCC) 03/11/2018   Chronic diarrhea 11/18/2017   Primary osteoarthritis of left knee 10/22/2016   Hepatic steatosis 03/26/2016   Gastroesophageal reflux disease without esophagitis 12/24/2015   Cavernous hemangioma of liver 09/21/2015   Secondary polycythemia 09/21/2015   Chronic insomnia 07/18/2015   History of IBS 06/05/2015   Arthropathic psoriasis, unspecified (HCC) 10/06/2008    Past Surgical History:  Procedure Laterality Date   ABDOMINAL HYSTERECTOMY     ANKLE ARTHROSCOPY Right 08/17/2020   Procedure: ANKLE ARTHROSCOPY- OCD repair; debridement, extensive;  Surgeon: Ashley Soulier, DPM;  Location: [facility];  Service: Podiatry;  Laterality: Right;   ANKLE RECONSTRUCTION Right 08/17/2020   Procedure: RECONSTRUCTION ANKLE- Brostrum-Gould;  Surgeon: Ashley Soulier, DPM;  Location: [facility];  Service: Podiatry;  Laterality: Right;   APPENDECTOMY     CHOLECYSTECTOMY     EYE SURGERY Left 1974   eye muscle   HAMMER TOE SURGERY Left 07/23/2022   Procedure: HAMMER TOE CORRECTION - SECOND AND THIRD;  Surgeon: Ashley Soulier, DPM;  Location: [facility];  Service: Podiatry;  Laterality: Left;   KNEE ARTHROPLASTY Left 04/08/2023   Procedure: ARTHROPLASTY, KNEE, TOTAL, USING IMAGELESS COMPUTER-ASSISTED NAVIGATION;  Surgeon: Mardee Lynwood SQUIBB, MD;  Location: [facility];  Service: Orthopedics;  Laterality: Left;   KNEE ARTHROSCOPY WITH MEDIAL MENISECTOMY Left 03/12/2017   Procedure: KNEE ARTHROSCOPY WITH MEDIAL MENISECTOMY;  Surgeon: Kathlynn Sharper, MD;  Location: [facility];  Service: Orthopedics;  Laterality: Left;   TENDON REPAIR Right 08/17/2020   Procedure: TENDON REPAIR- Flexor tendon repair-second; Tenolysis, multiple;  Surgeon: Ashley Soulier, DPM;  Location: [facility];  Service: Podiatry;  Laterality: Right;   TONSILLECTOMY      OB History   No obstetric history on file.      Home Medications    Prior to Admission medications  Medication Sig Start Date End Date Taking? Authorizing Provider  tirzepatide (ZEPBOUND) 2.5 MG/0.5ML injection vial Inject 2.5 mg into the skin. 05/27/23 05/26/24 Yes [provider]  albuterol (VENTOLIN HFA) 108 (90 Base) MCG/ACT inhaler SMARTSIG:2 inhalation Via Inhaler Every 6 Hours PRN    [provider]  aspirin  81 MG chewable tablet Chew 1 tablet (81 mg total) by mouth 2 (two) times daily after a meal. 04/09/23   Drake Chew, PA-C  celecoxib  (CELEBREX ) 200 MG capsule Take 1 capsule (200 mg total) by mouth 2 (two) times daily. 04/09/23   Drake Chew, PA-C  clotrimazole-betamethasone (LOTRISONE) cream Apply 1 Application topically 2 (two) times daily as needed (rash). 09/17/20   [provider]  colestipol  (COLESTID ) 1 g tablet Take 2 g by mouth 3 (three) times daily.    [provider]  COSENTYX SENSOREADY, 300 MG, 150 MG/ML SOAJ Inject 300 mg into the skin every 30 (thirty) days. 06/01/20   [provider]  FARXIGA  10 MG TABS tablet Take 1 tablet by mouth daily. 02/24/23 02/24/24  [provider]  Multiple Vitamin (MULTIVITAMIN WITH MINERALS) TABS tablet Take 1 tablet by mouth daily.    [provider]  oxyCODONE -acetaminophen  (PERCOCET/ROXICET) 5-325 MG tablet Take 1 tablet by mouth every 8 (eight) hours as needed for severe  pain (pain score 7-10). 08/02/20   [provider]  spironolactone  (ALDACTONE ) 25 MG tablet Take 1 tablet by mouth daily. 02/24/23 02/24/24  [provider]  SYNTHROID  112 MCG tablet Take 112 mcg by mouth daily before breakfast. 07/29/20   [provider]  traMADol  (ULTRAM ) 50 MG tablet Take 1-2 tablets (50-100 mg total) by mouth every 4 (four) hours as needed for moderate pain (pain score 4-6). 04/09/23   Drake Chew, PA-C    Family History Family History  Problem Relation Age of Onset   Multiple sclerosis Mother    Cirrhosis Father    Breast cancer Neg Hx     Social History Social History[1]   Allergies   Abatacept, Codeine, and Tomato   Review of Systems Review of Systems  Respiratory:  Negative for cough and shortness of breath.   Cardiovascular:  Negative for chest pain and palpitations.  Musculoskeletal:  Positive for arthralgias and joint swelling.  Skin:  Positive for color change. Negative for wound.  Neurological:  Negative for weakness  and numbness.     Physical Exam Triage Vital Signs ED Triage Vitals  Encounter Vitals Group     BP 01/15/24 1751 115/87     Girls Systolic BP Percentile --      Girls Diastolic BP Percentile --      Boys Systolic BP Percentile --      Boys Diastolic BP Percentile --      Pulse Rate 01/15/24 1751 85     Resp 01/15/24 1751 18     Temp 01/15/24 1751 98.7 F (37.1 C)     Temp src --      SpO2 01/15/24 1751 97 %     Weight --      Height --      Head Circumference --      Peak Flow --      Pain Score 01/15/24 1750 8     Pain Loc --      Pain Education --      Exclude from Growth Chart --    No data found.  Updated Vital Signs BP 115/87   Pulse 85   Temp 98.7 F (37.1 C)   Resp 18   SpO2 97%   Visual Acuity Right Eye Distance:   Left Eye Distance:   Bilateral Distance:    Right Eye Near:   Left Eye Near:    Bilateral Near:     Physical Exam Constitutional:      General: She is  not in acute distress. HENT:     Mouth/Throat:     Mouth: Mucous membranes are moist.  Cardiovascular:     Rate and Rhythm: Normal rate and regular rhythm.     Heart sounds: Normal heart sounds.  Pulmonary:     Effort: Pulmonary effort is normal. No respiratory distress.     Breath sounds: Normal breath sounds.  Musculoskeletal:        General: Swelling and tenderness present. No deformity.     Comments: Right hand and forearm acutely tender to palpation.  Right fingers and hand erythematous and edematous.  Skin:    General: Skin is warm and dry.     Capillary Refill: Capillary refill takes less than 2 seconds.     Findings: Erythema present. No lesion.  Neurological:     General: No focal deficit present.     Mental Status: She is alert.     Sensory: No sensory deficit.     Motor: No weakness.      UC Treatments / Results  Labs (all labs ordered are listed, but only abnormal results are displayed) Labs Reviewed - No data to display  EKG   Radiology No results found.  Procedures Procedures (including critical care time)  Medications Ordered in UC Medications - No data to display  Initial Impression / Assessment and Plan / UC Course  I have reviewed the triage vital signs and the nursing notes.  Pertinent labs & imaging results that were available during my care of the patient were reviewed by me and considered in my medical decision making (see chart for details).   Pain and swelling of right upper extremity, history of secondary polycythemia.  Patient is concern for blood clot.  Discussed limitations of evaluation in an urgent care setting, given that it is Friday evening.  Given her concern, medical history, sudden onset of symptoms, sending her to the ED.  She is agreeable to this and will go to [facility] ED now.   Final Clinical Impressions(s) /  UC Diagnoses   Final diagnoses:  Pain and swelling of upper extremity, right  History of polycythemia      Discharge Instructions      Go to the emergency department for the pain and swelling in your right hand and forearm, with concern for possible blood clot.     ED Prescriptions   None    PDMP not reviewed this encounter.    [1]  Social History Tobacco Use   Smoking status: Former    Types: Cigarettes   Smokeless tobacco: Never  Vaping Use   Vaping status: Never Used  Substance Use Topics   Alcohol use: No   Drug use: Not Currently    Types: Marijuana    Comment: no use since 2019     Corlis Burnard DEL, NP 01/15/24 1812  "

## 2024-01-15 NOTE — ED Provider Notes (Signed)
 " [facility] [facility] Provider Note   CSN: [id number] Arrival date & time: 01/15/24  8078     Patient presents with: Right Hand /Right Forearm Pain    Helen Garcia is a 64 y.o. female.  Has medical history significant for polycythemia presents today for right hand/finger pain radiating into the right forearm that began earlier this afternoon.  Denies injury, numbness, weakness, edema, shortness of breath, or chest pain.  Patient was seen at urgent care who recommended she come to the emergency department for ultrasound.   HPI     Prior to Admission medications  Medication Sig Start Date End Date Taking? Authorizing Provider  albuterol (VENTOLIN HFA) 108 (90 Base) MCG/ACT inhaler SMARTSIG:2 inhalation Via Inhaler Every 6 Hours PRN    [provider]  aspirin  81 MG chewable tablet Chew 1 tablet (81 mg total) by mouth 2 (two) times daily after a meal. 04/09/23   Drake Chew, PA-C  celecoxib  (CELEBREX ) 200 MG capsule Take 1 capsule (200 mg total) by mouth 2 (two) times daily. 04/09/23   Drake Chew, PA-C  clotrimazole-betamethasone (LOTRISONE) cream Apply 1 Application topically 2 (two) times daily as needed (rash). 09/17/20   [provider]  colestipol  (COLESTID ) 1 g tablet Take 2 g by mouth 3 (three) times daily.    [provider]  COSENTYX SENSOREADY, 300 MG, 150 MG/ML SOAJ Inject 300 mg into the skin every 30 (thirty) days. 06/01/20   [provider]  FARXIGA  10 MG TABS tablet Take 1 tablet by mouth daily. 02/24/23 02/24/24  [provider]  Multiple Vitamin (MULTIVITAMIN WITH MINERALS) TABS tablet Take 1 tablet by mouth daily.    [provider]  oxyCODONE -acetaminophen  (PERCOCET/ROXICET) 5-325 MG tablet Take 1 tablet by mouth every 8 (eight) hours as needed for severe pain (pain score 7-10). 08/02/20   [provider]  spironolactone  (ALDACTONE ) 25 MG tablet Take 1 tablet by mouth  daily. 02/24/23 02/24/24  [provider]  SYNTHROID  112 MCG tablet Take 112 mcg by mouth daily before breakfast. 07/29/20   [provider]  tirzepatide (ZEPBOUND) 2.5 MG/0.5ML injection vial Inject 2.5 mg into the skin. 05/27/23 05/26/24  [provider]  traMADol  (ULTRAM ) 50 MG tablet Take 1-2 tablets (50-100 mg total) by mouth every 4 (four) hours as needed for moderate pain (pain score 4-6). 04/09/23   Drake Chew, PA-C    Allergies: Abatacept, Codeine, and Tomato    Review of Systems  Musculoskeletal:  Positive for arthralgias.    Updated Vital Signs BP (!) 180/99 (BP Location: Left Wrist)   Pulse 92   Temp 98 F (36.7 C)   Resp 18   SpO2 97%   Physical Exam Vitals and nursing note reviewed.  Constitutional:      General: She is not in acute distress.    Appearance: She is well-developed.  HENT:     Head: Normocephalic and atraumatic.  Eyes:     Conjunctiva/sclera: Conjunctivae normal.  Cardiovascular:     Rate and Rhythm: Normal rate and regular rhythm.     Heart sounds: No murmur heard. Pulmonary:     Effort: Pulmonary effort is normal. No respiratory distress.     Breath sounds: Normal breath sounds.  Abdominal:     Palpations: Abdomen is soft.     Tenderness: There is no abdominal tenderness.  Musculoskeletal:        General: Swelling and tenderness present.     Cervical back: Neck  supple.     Comments: Swelling to right dorsum of hand at the 2nd and 3rd metacarpals with no other appreciable edema or pitting.  Patient does have pain with palpation of the right upper extremity at the forearm and distally.  Patient neurovascularly intact with +2 radial pulses  Skin:    General: Skin is warm and dry.     Capillary Refill: Capillary refill takes less than 2 seconds.  Neurological:     Mental Status: She is alert.  Psychiatric:        Mood and Affect: Mood normal.     (all labs ordered are listed, but only abnormal results are  displayed) Labs Reviewed - No data to display  EKG: None  Radiology: No results found.   Procedures   Medications Ordered in the ED - No data to display                                  Medical Decision Making  This patient presents to the ED for concern of right upper extremity pain and swelling differential diagnosis includes arthritis, musculoskeletal pain, DVT, arterial occlusion   Problem List / ED Course:  Using shared decision making and given there is no vascular ultrasound available at this time, patient has decided to forego Lovenox injection until she has an outpatient ultrasound done.  Ambulatory DVT study ordered prior to discharge. Considered for admission or further workup however patient's vital signs and physical exam are reassuring.  Patient has no signs or symptoms concerning for PE or arterial occlusion at this time.  Patient given ambulatory referral for DVT study tomorrow morning.  Patient given return precautions.  I feel patient is safe for discharge at this time.      Final diagnoses:  Swelling of right hand    ED Discharge Orders          Ordered    UE Venous Duplex       Comments: IMPORTANT PATIENT INSTRUCTIONS: You have been scheduled for an Outpatient Vascular Study at [facility].  If tomorrow is a Saturday, Sunday or holiday, please go to the [facility] Emergency Department Registration Desk at 11 am tomorrow morning and tell them you are there for a vascular study.  If tomorrow is a weekday (Monday-Friday), please go to the [facility] (address [address], [place]) at 8 am and report to the 4th floor registration Zone A.  Inform registration that you are there for a vascular study.   01/15/24 2007               Francis Ileana SAILOR, PA-C 01/15/24 2019    Cleotilde Rogue, MD 01/15/24 2243  "

## 2024-01-15 NOTE — ED Triage Notes (Signed)
 Patient reports right hand and fingers pain radiating to right forearm onset this afternoon , denies injury , she is concerned about blood clot .

## 2024-01-15 NOTE — ED Triage Notes (Signed)
 Patient to Urgent Care with complaints of right sided hand pain and swelling. Difficulty moving fingers.  Pain radiates into elbow.  Denies any known injury. Symptoms started suddenly around 1330 this afternoon.

## 2024-01-20 ENCOUNTER — Ambulatory Visit: Admitting: Neurosurgery

## 2024-01-20 ENCOUNTER — Encounter: Payer: Self-pay | Admitting: Neurosurgery

## 2024-01-20 VITALS — BP 126/76 | Ht 65.0 in | Wt 215.0 lb

## 2024-01-20 DIAGNOSIS — G5731 Lesion of lateral popliteal nerve, right lower limb: Secondary | ICD-10-CM

## 2024-01-20 DIAGNOSIS — G8929 Other chronic pain: Secondary | ICD-10-CM | POA: Diagnosis not present

## 2024-01-20 DIAGNOSIS — M25561 Pain in right knee: Secondary | ICD-10-CM | POA: Diagnosis not present

## 2024-01-20 DIAGNOSIS — G5730 Lesion of lateral popliteal nerve, unspecified lower limb: Secondary | ICD-10-CM

## 2024-03-23 ENCOUNTER — Ambulatory Visit: Admitting: Oncology

## 2024-09-13 ENCOUNTER — Ambulatory Visit: Admitting: Dermatology
